# Patient Record
Sex: Female | Born: 1985 | Race: White | Hispanic: No | Marital: Single | Smoking: Current every day smoker
Health system: Southern US, Community
[De-identification: ages and names within clinical notes are randomized; demographics above are authoritative.]

## PROBLEM LIST (undated history)

## (undated) DIAGNOSIS — K759 Inflammatory liver disease, unspecified: Secondary | ICD-10-CM

## (undated) HISTORY — PX: ECTOPIC PREGNANCY SURGERY: SHX613

---

## 2010-02-18 ENCOUNTER — Emergency Department (HOSPITAL_BASED_OUTPATIENT_CLINIC_OR_DEPARTMENT_OTHER)
Admission: EM | Admit: 2010-02-18 | Discharge: 2010-02-18 | Payer: Self-pay | Source: Home / Self Care | Admitting: Emergency Medicine

## 2018-12-23 ENCOUNTER — Emergency Department (HOSPITAL_BASED_OUTPATIENT_CLINIC_OR_DEPARTMENT_OTHER)
Admission: EM | Admit: 2018-12-23 | Discharge: 2018-12-23 | Disposition: A | Discharge status: Home / Self Care | Payer: Self-pay | Attending: Emergency Medicine | Admitting: Emergency Medicine

## 2018-12-23 ENCOUNTER — Other Ambulatory Visit: Payer: Self-pay

## 2018-12-23 ENCOUNTER — Encounter (HOSPITAL_BASED_OUTPATIENT_CLINIC_OR_DEPARTMENT_OTHER): Payer: Self-pay | Admitting: Emergency Medicine

## 2018-12-23 DIAGNOSIS — J020 Streptococcal pharyngitis: Secondary | ICD-10-CM | POA: Insufficient documentation

## 2018-12-23 MED ORDER — IBUPROFEN 400 MG PO TABS
ORAL_TABLET | ORAL | Status: AC
  Filled 2018-12-23: qty 2

## 2018-12-23 MED ORDER — IBUPROFEN 600 MG PO TABS: 600.0000 mg | ORAL_TABLET | Freq: Four times a day (QID) | ORAL | 0 refills | Status: DC | PRN

## 2018-12-23 MED ORDER — IBUPROFEN 800 MG PO TABS
800.0000 mg | ORAL_TABLET | Freq: Once | ORAL | Status: AC
  Administered 2018-12-23: 800 mg via ORAL

## 2018-12-23 MED ORDER — LIDOCAINE VISCOUS HCL 2 % MT SOLN
15.0000 mL | Freq: Once | OROMUCOSAL | Status: AC
  Administered 2018-12-23: 15 mL via OROMUCOSAL
  Filled 2018-12-23: qty 15

## 2018-12-23 MED ORDER — PENICILLIN G BENZATHINE 1200000 UNIT/2ML IM SUSP
1.2000 10*6.[IU] | Freq: Once | INTRAMUSCULAR | Status: AC
  Administered 2018-12-23: 1.2 10*6.[IU] via INTRAMUSCULAR
  Filled 2018-12-23: qty 2

## 2018-12-23 NOTE — ED Triage Notes (Signed)
Sore throat with fever x 3 days.  Not improving.  Pt also fell last night and hit her mouth.  Throat is red and noted white splotches.

## 2018-12-23 NOTE — Discharge Instructions (Signed)
You should start feel better over the next 1 to 2 days.  You may take Tylenol and ibuprofen as needed for body aches, sore throat and fever.

## 2018-12-23 NOTE — ED Provider Notes (Signed)
[facility] EMERGENCY DEPARTMENT Provider Note   CSN: [id number] Arrival date & time: 12/23/18  1406    History   Chief Complaint Chief Complaint  Patient presents with  . Sore Throat    HPI Victoria Valenzuela is a 33 y.o. female.     HPI Patient states she has had 4 to 5 days of sore throat, fever, chills, diffuse myalgias.  She has taken no Tylenol or ibuprofen prior to coming to the emergency department.  Tolerating secretions.  No voice changes.  No known sick contacts.  No shortness of breath or coughing. No past medical history on file.  There are no active problems to display for this patient.    OB History   No obstetric history on file.      Home Medications    Prior to Admission medications   Medication Sig Start Date End Date Taking? Authorizing Provider  ibuprofen (ADVIL) 600 MG tablet Take 1 tablet (600 mg total) by mouth every 6 (six) hours as needed. 12/23/18   Loren Racer, MD    Family History No family history on file.  Social History Social History   Tobacco Use  . Smoking status: Not on file  Substance Use Topics  . Alcohol use: Not on file  . Drug use: Not on file     Allergies   Patient has no known allergies.   Review of Systems Review of Systems  Constitutional: Positive for chills and fever.  HENT: Positive for sore throat. Negative for congestion and voice change.   Respiratory: Negative for cough and shortness of breath.   Skin: Negative for rash.  All other systems reviewed and are negative.    Physical Exam Updated Vital Signs BP 126/79   Pulse (!) 107   Temp 99.8 F (37.7 C)   Ht 5\' 8"  (1.727 m)   Wt 81.6 kg   SpO2 97%   BMI 27.37 kg/m   Physical Exam Vitals signs and nursing note reviewed.  Constitutional:      Appearance: Normal appearance. She is well-developed.     Comments: Tearful  HENT:     Head: Normocephalic and atraumatic.     Mouth/Throat:     Mouth: Mucous membranes are moist.   Pharynx: Oropharyngeal exudate and posterior oropharyngeal erythema present.     Comments: Bilateral tonsillar hypertrophy with exudates in the left tonsil.  Uvula is midline.  No evidence of peritonsillar abscess.  No trismus. Tolerating secretions well. Eyes:     Extraocular Movements: Extraocular movements intact.     Pupils: Pupils are equal, round, and reactive to light.  Neck:     Musculoskeletal: Normal range of motion and neck supple. No neck rigidity or muscular tenderness.     Comments: No meningismus Cardiovascular:     Rate and Rhythm: Normal rate.  Pulmonary:     Effort: Pulmonary effort is normal.  Musculoskeletal: Normal range of motion.        General: No tenderness.  Lymphadenopathy:     Cervical: Cervical adenopathy present.  Skin:    General: Skin is warm and dry.     Findings: No erythema or rash.  Neurological:     General: No focal deficit present.     Mental Status: She is alert and oriented to person, place, and time.  Psychiatric:        Behavior: Behavior normal.      ED Treatments / Results  Labs (all labs ordered are listed, but only abnormal  results are displayed) Labs Reviewed - No data to display  EKG None  Radiology No results found.  Procedures Procedures (including critical care time)  Medications Ordered in ED Medications  penicillin g benzathine (BICILLIN LA) 1200000 UNIT/2ML injection 1.2 Million Units (has no administration in time range)  ibuprofen (ADVIL) tablet 800 mg (800 mg Oral Given 12/23/18 1419)     Initial Impression / Assessment and Plan / ED Course  I have reviewed the triage vital signs and the nursing notes.  Pertinent labs & imaging results that were available during my care of the patient were reviewed by me and considered in my medical decision making (see chart for details).        Given IM penicillin and will treat with ibuprofen.  No evidence of complications due to strep throat.  Encouraged plenty of  fluid intake.  Return precautions given.  Final Clinical Impressions(s) / ED Diagnoses   Final diagnoses:  Strep throat    ED Discharge Orders         Ordered    ibuprofen (ADVIL) 600 MG tablet  Every 6 hours PRN     12/23/18 1423           Loren RacerYelverton, Norlene Lanes, MD 12/23/18 1429

## 2019-02-22 ENCOUNTER — Encounter (HOSPITAL_COMMUNITY): Payer: Self-pay | Admitting: Emergency Medicine

## 2019-02-22 ENCOUNTER — Inpatient Hospital Stay (HOSPITAL_COMMUNITY)
Admission: EM | Admit: 2019-02-22 | Discharge: 2019-02-25 | DRG: 442 | Disposition: A | Discharge status: Home / Self Care | Payer: Self-pay | Attending: Internal Medicine | Admitting: Internal Medicine

## 2019-02-22 ENCOUNTER — Emergency Department (HOSPITAL_BASED_OUTPATIENT_CLINIC_OR_DEPARTMENT_OTHER)
Admission: EM | Admit: 2019-02-22 | Discharge: 2019-02-22 | Discharge status: Against Medical Advice | Payer: Self-pay | Attending: Emergency Medicine | Admitting: Emergency Medicine

## 2019-02-22 ENCOUNTER — Other Ambulatory Visit: Payer: Self-pay

## 2019-02-22 ENCOUNTER — Encounter (HOSPITAL_BASED_OUTPATIENT_CLINIC_OR_DEPARTMENT_OTHER): Payer: Self-pay | Admitting: Emergency Medicine

## 2019-02-22 ENCOUNTER — Emergency Department (HOSPITAL_BASED_OUTPATIENT_CLINIC_OR_DEPARTMENT_OTHER): Payer: Self-pay

## 2019-02-22 DIAGNOSIS — N760 Acute vaginitis: Secondary | ICD-10-CM | POA: Diagnosis present

## 2019-02-22 DIAGNOSIS — F1110 Opioid abuse, uncomplicated: Secondary | ICD-10-CM | POA: Diagnosis present

## 2019-02-22 DIAGNOSIS — F1910 Other psychoactive substance abuse, uncomplicated: Secondary | ICD-10-CM | POA: Insufficient documentation

## 2019-02-22 DIAGNOSIS — A599 Trichomoniasis, unspecified: Secondary | ICD-10-CM | POA: Diagnosis present

## 2019-02-22 DIAGNOSIS — K759 Inflammatory liver disease, unspecified: Secondary | ICD-10-CM | POA: Insufficient documentation

## 2019-02-22 DIAGNOSIS — B159 Hepatitis A without hepatic coma: Principal | ICD-10-CM

## 2019-02-22 DIAGNOSIS — B179 Acute viral hepatitis, unspecified: Secondary | ICD-10-CM | POA: Diagnosis present

## 2019-02-22 DIAGNOSIS — N730 Acute parametritis and pelvic cellulitis: Secondary | ICD-10-CM | POA: Diagnosis present

## 2019-02-22 DIAGNOSIS — F17200 Nicotine dependence, unspecified, uncomplicated: Secondary | ICD-10-CM | POA: Diagnosis present

## 2019-02-22 DIAGNOSIS — E876 Hypokalemia: Secondary | ICD-10-CM | POA: Diagnosis present

## 2019-02-22 DIAGNOSIS — Z20828 Contact with and (suspected) exposure to other viral communicable diseases: Secondary | ICD-10-CM | POA: Diagnosis present

## 2019-02-22 DIAGNOSIS — F17210 Nicotine dependence, cigarettes, uncomplicated: Secondary | ICD-10-CM | POA: Insufficient documentation

## 2019-02-22 DIAGNOSIS — K7290 Hepatic failure, unspecified without coma: Secondary | ICD-10-CM | POA: Diagnosis present

## 2019-02-22 DIAGNOSIS — R17 Unspecified jaundice: Secondary | ICD-10-CM

## 2019-02-22 DIAGNOSIS — B1920 Unspecified viral hepatitis C without hepatic coma: Secondary | ICD-10-CM

## 2019-02-22 DIAGNOSIS — R1011 Right upper quadrant pain: Secondary | ICD-10-CM

## 2019-02-22 DIAGNOSIS — N739 Female pelvic inflammatory disease, unspecified: Secondary | ICD-10-CM

## 2019-02-22 HISTORY — DX: Inflammatory liver disease, unspecified: K75.9

## 2019-02-22 LAB — URINALYSIS, ROUTINE W REFLEX MICROSCOPIC
Glucose, UA: 100 mg/dL — AB
Ketones, ur: 15 mg/dL — AB
Nitrite: POSITIVE — AB
Protein, ur: 30 mg/dL — AB
Specific Gravity, Urine: >1.03 — ABNORMAL HIGH (ref 1.005–1.030)
pH: 6.5 (ref 5.0–8.0)

## 2019-02-22 LAB — CBC
HCT: 39.5 % (ref 36.0–46.0)
Hemoglobin: 12.7 g/dL (ref 12.0–15.0)
MCH: 28 pg (ref 26.0–34.0)
MCHC: 32.2 g/dL (ref 30.0–36.0)
MCV: 87.2 fL (ref 80.0–100.0)
Platelets: 313 10*3/uL (ref 150–400)
RBC: 4.53 MIL/uL (ref 3.87–5.11)
RDW: 15.6 % — ABNORMAL HIGH (ref 11.5–15.5)
WBC: 4.9 10*3/uL (ref 4.0–10.5)
nRBC: 0 % (ref 0.0–0.2)

## 2019-02-22 LAB — COMPREHENSIVE METABOLIC PANEL
ALT: 1120 U/L — ABNORMAL HIGH (ref 0–44)
AST: 730 U/L — ABNORMAL HIGH (ref 15–41)
Albumin: 3.2 g/dL — ABNORMAL LOW (ref 3.5–5.0)
Alkaline Phosphatase: 156 U/L — ABNORMAL HIGH (ref 38–126)
Anion gap: 9 (ref 5–15)
BUN: 5 mg/dL — ABNORMAL LOW (ref 6–20)
CO2: 27 mmol/L (ref 22–32)
Calcium: 8.7 mg/dL — ABNORMAL LOW (ref 8.9–10.3)
Chloride: 99 mmol/L (ref 98–111)
Creatinine, Ser: 0.67 mg/dL (ref 0.44–1.00)
GFR calc Af Amer: >60 mL/min (ref >60)
GFR calc non Af Amer: >60 mL/min (ref >60)
Glucose, Bld: 96 mg/dL (ref 70–99)
Potassium: 3 mmol/L — ABNORMAL LOW (ref 3.5–5.1)
Sodium: 135 mmol/L (ref 135–145)
Total Bilirubin: 7.2 mg/dL — ABNORMAL HIGH (ref 0.3–1.2)
Total Protein: 6.9 g/dL (ref 6.5–8.1)

## 2019-02-22 LAB — URINALYSIS, MICROSCOPIC (REFLEX)

## 2019-02-22 LAB — PROTIME-INR
INR: 1.3 — ABNORMAL HIGH (ref 0.8–1.2)
Prothrombin Time: 15.6 seconds — ABNORMAL HIGH (ref 11.4–15.2)

## 2019-02-22 LAB — ETHANOL: Alcohol, Ethyl (B): <10 mg/dL (ref <10)

## 2019-02-22 LAB — ACETAMINOPHEN LEVEL: Acetaminophen (Tylenol), Serum: <10 ug/mL — ABNORMAL LOW (ref 10–30)

## 2019-02-22 LAB — PREGNANCY, URINE: Preg Test, Ur: NEGATIVE

## 2019-02-22 LAB — LIPASE, BLOOD: Lipase: 29 U/L (ref 11–51)

## 2019-02-22 MED ORDER — SODIUM CHLORIDE 0.9 % IV BOLUS
1000.0000 mL | Freq: Once | INTRAVENOUS | Status: AC
  Administered 2019-02-22: 1000 mL via INTRAVENOUS

## 2019-02-22 MED ORDER — ONDANSETRON HCL 4 MG/2ML IJ SOLN
4.0000 mg | Freq: Once | INTRAMUSCULAR | Status: AC
  Administered 2019-02-22: 18:00:00 4 mg via INTRAVENOUS
  Filled 2019-02-22: qty 2

## 2019-02-22 MED ORDER — POTASSIUM CHLORIDE 10 MEQ/100ML IV SOLN
10.0000 meq | Freq: Once | INTRAVENOUS | Status: AC
  Administered 2019-02-22: 10 meq via INTRAVENOUS
  Filled 2019-02-22: qty 100

## 2019-02-22 NOTE — ED Provider Notes (Signed)
[facility] EMERGENCY DEPARTMENT Provider Note   CSN: [id number] Arrival date & time: 02/22/19  1626   History   Chief Complaint Chief Complaint  Patient presents with  . Abdominal Pain    HPI Victoria Valenzuela is a 33 y.o. female who presents with weakness.  Past medical history significant for IV drug use, polysubstance abuse.  The patient states that for the past 4 days she has had intermittent generalized abdominal pain, nausea, vomiting, diarrhea.  She states that she has been progressively weaker which is the reason why she came to the ED today.  She also has noticed that her eyes have become yellow in her urine is brown or orange and she urinates frequently.  She reports an associated fever couple of days ago.  The diarrhea is light in color. Last time she used heroin was 2 days ago. She states she OD'd on heroin last week and EMS was called out but she didn't want to go to the hospital at that time. She denies significant alcohol use. She smokes marijuana but denies other drug use.   HPI  History reviewed. No pertinent past medical history.  There are no active problems to display for this patient.    The histories are not reviewed yet. Please review them in the "History" navigator section and refresh this [place].   OB History   No obstetric history on file.      Home Medications    Prior to Admission medications   Medication Sig Start Date End Date Taking? Authorizing Provider  ibuprofen (ADVIL) 600 MG tablet Take 1 tablet (600 mg total) by mouth every 6 (six) hours as needed. 12/23/18   Julianne Rice, MD    Family History No family history on file.  Social History Social History   Tobacco Use  . Smoking status: Current Every Day Smoker  . Smokeless tobacco: Never Used  Substance Use Topics  . Alcohol use: Yes    Comment: daily  . Drug use: Yes    Comment: heroin     Allergies   Patient has no known allergies.   Review of Systems Review  of Systems  Constitutional: Positive for fatigue and fever. Negative for chills.  Respiratory: Negative for shortness of breath.   Cardiovascular: Negative for chest pain.  Gastrointestinal: Positive for abdominal pain, diarrhea, nausea and vomiting.  Genitourinary: Positive for frequency. Negative for dysuria.       +dark urine  Neurological: Positive for weakness.  All other systems reviewed and are negative.    Physical Exam Updated Vital Signs BP 114/71 (BP Location: Right Arm)   Pulse 95   Temp 99 F (37.2 C) (Oral)   Resp 20   Ht 5\' 9"  (1.753 m)   Wt 81.6 kg   LMP 01/22/2019   SpO2 99%   BMI 26.58 kg/m   Physical Exam Vitals signs and nursing note reviewed.  Constitutional:      General: She is not in acute distress.    Appearance: She is well-developed. She is ill-appearing.     Comments: Calm and cooperative  HENT:     Head: Normocephalic and atraumatic.  Eyes:     General: Scleral icterus present.        Right eye: No discharge.        Left eye: No discharge.     Conjunctiva/sclera: Conjunctivae normal.     Pupils: Pupils are equal, round, and reactive to light.  Neck:     Musculoskeletal: Normal  range of motion.  Cardiovascular:     Rate and Rhythm: Normal rate and regular rhythm.  Pulmonary:     Effort: Pulmonary effort is normal. No respiratory distress.     Breath sounds: Normal breath sounds.  Abdominal:     General: There is no distension.     Palpations: Abdomen is soft.     Tenderness: There is no abdominal tenderness.  Skin:    General: Skin is warm and dry.  Neurological:     Mental Status: She is alert and oriented to person, place, and time.  Psychiatric:        Behavior: Behavior normal.      ED Treatments / Results  Labs (all labs ordered are listed, but only abnormal results are displayed) Labs Reviewed  COMPREHENSIVE METABOLIC PANEL - Abnormal; Notable for the following components:      Result Value   Potassium 3.0 (*)     BUN 5 (*)    Calcium 8.7 (*)    Albumin 3.2 (*)    AST 730 (*)    ALT 1,120 (*)    Alkaline Phosphatase 156 (*)    Total Bilirubin 7.2 (*)    All other components within normal limits  CBC - Abnormal; Notable for the following components:   RDW 15.6 (*)    All other components within normal limits  URINALYSIS, ROUTINE W REFLEX MICROSCOPIC - Abnormal; Notable for the following components:   Color, Urine BROWN (*)    APPearance CLOUDY (*)    Specific Gravity, Urine >1.030 (*)    Glucose, UA 100 (*)    Hgb urine dipstick TRACE (*)    Bilirubin Urine LARGE (*)    Ketones, ur 15 (*)    Protein, ur 30 (*)    Nitrite POSITIVE (*)    Leukocytes,Ua TRACE (*)    All other components within normal limits  URINALYSIS, MICROSCOPIC (REFLEX) - Abnormal; Notable for the following components:   Bacteria, UA MANY (*)    All other components within normal limits  PROTIME-INR - Abnormal; Notable for the following components:   Prothrombin Time 15.6 (*)    INR 1.3 (*)    All other components within normal limits  ACETAMINOPHEN LEVEL - Abnormal; Notable for the following components:   Acetaminophen (Tylenol), Serum <10 (*)    All other components within normal limits  URINE CULTURE  LIPASE, BLOOD  PREGNANCY, URINE  ETHANOL  HEPATITIS PANEL, ACUTE    EKG None  Radiology [place] Abdomen Limited Ruq  Result Date: 02/22/2019 CLINICAL DATA:  Epigastric pain EXAM: ULTRASOUND ABDOMEN LIMITED RIGHT UPPER QUADRANT COMPARISON:  None. FINDINGS: Gallbladder: thickened gallbladder wall to 7 mm. The gallbladder is nondistended. No gallstones are present. No pericholecystic fluid. Negative sonographic Murphy's sign. Common bile duct: Diameter: Normal at 4 mm Liver: Homogeneous liver echotexture. No duct dilatation. Portal vein is patent on color Doppler imaging with normal direction of blood flow towards the liver. Other: None. IMPRESSION: 1. Thickened gallbladder wall without evidence of cholecystitis. No  gallstones present. No gallbladder distension. 2. Normal common bile duct and liver. Electronically Signed   By: Genevive BiStewart  Edmunds M.D.   On: 02/22/2019 18:38    Procedures Procedures (including critical care time)  Medications Ordered in ED Medications  sodium chloride 0.9 % bolus 1,000 mL ( Intravenous Stopped 02/22/19 1913)  ondansetron (ZOFRAN) injection 4 mg (4 mg Intravenous Given 02/22/19 1750)  potassium chloride 10 mEq in 100 mL IVPB ( Intravenous Stopped 02/22/19 1858)  Initial Impression / Assessment and Plan / ED Course  I have reviewed the triage vital signs and the nursing notes.  Pertinent labs & imaging results that were available during my care of the patient were reviewed by me and considered in my medical decision making (see chart for details).  33 year old female presents with subjective fever, abdominal pain, nausea, vomiting, diarrhea and jaundice.  Her vital signs are normal here.  On exam she is mildly jaundiced appearing.  Her heart is regular rate and rhythm.  Lungs are clear to auscultation.  Abdomen is soft and nontender.  She appears generally weak and fatigued.  Will order labs and give fluids and Zofran.  CBC is normal.  CMP is remarkable for hypokalemia (3), and significantly elevated transaminases and bilirubin.  AST is 730, ALT is 1120, bilirubin is 7.2, alkaline phosphatase is 156.  INR is 1.3.  Right upper quadrant ultrasound was obtained which shows a thickened gallbladder wall without evidence of cholecystitis.  Hepatitis panel was added.    Discussed with Dr. Levora AngelBrahmbhatt of GI who recommends admission to the hospital for supportive care.  I discussed this with the patient and she is refusing stating that she needs to go take care of her kids.  She has a brother but otherwise has no other family to help her.  At this time she has capacity to make her own decisions.  She understands the risks of leaving.  She states that she just needs to get her kids situated  and will come back tonight.  [facility] was [facility]. Pt left AMA.  Final Clinical Impressions(s) / ED Diagnoses   Final diagnoses:  RUQ abdominal pain  Hepatitis    ED Discharge Orders    None       Bethel BornGekas, Kinneth Fujiwara Marie, PA-C 02/22/19 2146    Pricilla LovelessGoldston, Scott, MD 02/23/19 2248

## 2019-02-22 NOTE — ED Triage Notes (Addendum)
Generalized abd pain, vomiting, diarrhea, yellowing of the eyes x 1 week. She recently had a heroin OD. She looks unwell.

## 2019-02-22 NOTE — Discharge Instructions (Addendum)
Do not take any Tylenol or drink any alcohol

## 2019-02-22 NOTE — ED Triage Notes (Signed)
Patient reports upper abdominal pain for 4 days with emesis and diarrhea , denies fever or chills , seen at [facility] ER this afternoon - blood tests , urine test and ultrasound done at [place].

## 2019-02-23 ENCOUNTER — Emergency Department (HOSPITAL_COMMUNITY): Payer: Self-pay

## 2019-02-23 ENCOUNTER — Inpatient Hospital Stay (HOSPITAL_COMMUNITY): Payer: Self-pay

## 2019-02-23 DIAGNOSIS — R17 Unspecified jaundice: Secondary | ICD-10-CM

## 2019-02-23 DIAGNOSIS — F1110 Opioid abuse, uncomplicated: Secondary | ICD-10-CM | POA: Diagnosis present

## 2019-02-23 DIAGNOSIS — N730 Acute parametritis and pelvic cellulitis: Secondary | ICD-10-CM | POA: Diagnosis present

## 2019-02-23 DIAGNOSIS — B179 Acute viral hepatitis, unspecified: Secondary | ICD-10-CM | POA: Diagnosis present

## 2019-02-23 DIAGNOSIS — E876 Hypokalemia: Secondary | ICD-10-CM | POA: Diagnosis present

## 2019-02-23 LAB — C-REACTIVE PROTEIN: CRP: 3.6 mg/dL — ABNORMAL HIGH (ref <1.0)

## 2019-02-23 LAB — COMPREHENSIVE METABOLIC PANEL
ALT: 730 U/L — ABNORMAL HIGH (ref 0–44)
AST: 318 U/L — ABNORMAL HIGH (ref 15–41)
Albumin: 2.5 g/dL — ABNORMAL LOW (ref 3.5–5.0)
Alkaline Phosphatase: 170 U/L — ABNORMAL HIGH (ref 38–126)
Anion gap: 8 (ref 5–15)
BUN: <5 mg/dL — ABNORMAL LOW (ref 6–20)
CO2: 23 mmol/L (ref 22–32)
Calcium: 8.2 mg/dL — ABNORMAL LOW (ref 8.9–10.3)
Chloride: 106 mmol/L (ref 98–111)
Creatinine, Ser: 0.65 mg/dL (ref 0.44–1.00)
GFR calc Af Amer: >60 mL/min (ref >60)
GFR calc non Af Amer: >60 mL/min (ref >60)
Glucose, Bld: 107 mg/dL — ABNORMAL HIGH (ref 70–99)
Potassium: 3.4 mmol/L — ABNORMAL LOW (ref 3.5–5.1)
Sodium: 137 mmol/L (ref 135–145)
Total Bilirubin: 5.4 mg/dL — ABNORMAL HIGH (ref 0.3–1.2)
Total Protein: 5.8 g/dL — ABNORMAL LOW (ref 6.5–8.1)

## 2019-02-23 LAB — RAPID URINE DRUG SCREEN, HOSP PERFORMED
Amphetamines: POSITIVE — AB
Barbiturates: NOT DETECTED
Benzodiazepines: NOT DETECTED
Cocaine: POSITIVE — AB
Opiates: NOT DETECTED
Tetrahydrocannabinol: POSITIVE — AB

## 2019-02-23 LAB — SARS CORONAVIRUS 2 BY RT PCR (HOSPITAL ORDER, PERFORMED IN ~~LOC~~ HOSPITAL LAB): SARS Coronavirus 2: NEGATIVE

## 2019-02-23 LAB — WET PREP, GENITAL
Sperm: NONE SEEN
Yeast Wet Prep HPF POC: NONE SEEN

## 2019-02-23 LAB — RPR: RPR Ser Ql: NONREACTIVE

## 2019-02-23 LAB — CBC
HCT: 33.3 % — ABNORMAL LOW (ref 36.0–46.0)
Hemoglobin: 11.3 g/dL — ABNORMAL LOW (ref 12.0–15.0)
MCH: 28.9 pg (ref 26.0–34.0)
MCHC: 33.9 g/dL (ref 30.0–36.0)
MCV: 85.2 fL (ref 80.0–100.0)
Platelets: 251 10*3/uL (ref 150–400)
RBC: 3.91 MIL/uL (ref 3.87–5.11)
RDW: 16 % — ABNORMAL HIGH (ref 11.5–15.5)
WBC: 4.7 10*3/uL (ref 4.0–10.5)
nRBC: 0 % (ref 0.0–0.2)

## 2019-02-23 LAB — HIV ANTIBODY (ROUTINE TESTING W REFLEX): HIV Screen 4th Generation wRfx: NONREACTIVE

## 2019-02-23 LAB — AMMONIA: Ammonia: 71 umol/L — ABNORMAL HIGH (ref 9–35)

## 2019-02-23 LAB — PROTIME-INR
INR: 1.3 — ABNORMAL HIGH (ref 0.8–1.2)
Prothrombin Time: 15.9 seconds — ABNORMAL HIGH (ref 11.4–15.2)

## 2019-02-23 MED ORDER — DOXYCYCLINE HYCLATE 100 MG PO TABS
100.0000 mg | ORAL_TABLET | Freq: Two times a day (BID) | ORAL | Status: DC
  Administered 2019-02-23 – 2019-02-25 (×5): 100 mg via ORAL
  Filled 2019-02-23 (×6): qty 1

## 2019-02-23 MED ORDER — KETOROLAC TROMETHAMINE 30 MG/ML IJ SOLN
30.0000 mg | Freq: Four times a day (QID) | INTRAMUSCULAR | Status: DC | PRN
  Administered 2019-02-23 – 2019-02-25 (×7): 30 mg via INTRAVENOUS
  Filled 2019-02-23 (×7): qty 1

## 2019-02-23 MED ORDER — METRONIDAZOLE 500 MG PO TABS
500.0000 mg | ORAL_TABLET | Freq: Two times a day (BID) | ORAL | Status: DC
  Administered 2019-02-23 – 2019-02-25 (×5): 500 mg via ORAL
  Filled 2019-02-23 (×5): qty 1

## 2019-02-23 MED ORDER — ONDANSETRON HCL 4 MG/2ML IJ SOLN
4.0000 mg | Freq: Once | INTRAMUSCULAR | Status: AC
  Administered 2019-02-23: 4 mg via INTRAVENOUS
  Filled 2019-02-23: qty 2

## 2019-02-23 MED ORDER — POTASSIUM CHLORIDE 10 MEQ/100ML IV SOLN
10.0000 meq | Freq: Once | INTRAVENOUS | Status: AC
  Administered 2019-02-23: 05:00:00 10 meq via INTRAVENOUS
  Filled 2019-02-23: qty 100

## 2019-02-23 MED ORDER — TRAZODONE HCL 50 MG PO TABS
50.0000 mg | ORAL_TABLET | Freq: Once | ORAL | Status: AC
  Administered 2019-02-23: 23:00:00 50 mg via ORAL
  Filled 2019-02-23: qty 1

## 2019-02-23 MED ORDER — DEXTROSE-NACL 5-0.9 % IV SOLN
INTRAVENOUS | Status: DC
  Administered 2019-02-23: 07:00:00 via INTRAVENOUS

## 2019-02-23 MED ORDER — ONDANSETRON HCL 4 MG PO TABS: 4.0000 mg | ORAL_TABLET | Freq: Four times a day (QID) | ORAL | Status: DC | PRN

## 2019-02-23 MED ORDER — LACTATED RINGERS IV SOLN: INTRAVENOUS | Status: DC

## 2019-02-23 MED ORDER — ONDANSETRON HCL 4 MG/2ML IJ SOLN
4.0000 mg | Freq: Four times a day (QID) | INTRAMUSCULAR | Status: DC | PRN
  Administered 2019-02-23: 16:00:00 4 mg via INTRAVENOUS
  Filled 2019-02-23: qty 2

## 2019-02-23 MED ORDER — POTASSIUM CHLORIDE CRYS ER 20 MEQ PO TBCR
40.0000 meq | EXTENDED_RELEASE_TABLET | Freq: Once | ORAL | Status: AC
  Administered 2019-02-23: 04:00:00 40 meq via ORAL
  Filled 2019-02-23 (×2): qty 2

## 2019-02-23 MED ORDER — SODIUM CHLORIDE 0.9 % IV SOLN
2.0000 g | Freq: Once | INTRAVENOUS | Status: AC
  Administered 2019-02-23: 07:00:00 2 g via INTRAVENOUS
  Filled 2019-02-23 (×2): qty 2

## 2019-02-23 MED ORDER — DEXTROSE 5 % IV SOLN
15.0000 mg/kg/h | INTRAVENOUS | Status: DC
  Administered 2019-02-23 – 2019-02-24 (×2): 15 mg/kg/h via INTRAVENOUS
  Filled 2019-02-23 (×2): qty 120

## 2019-02-23 MED ORDER — ACETYLCYSTEINE LOAD VIA INFUSION
150.0000 mg/kg | Freq: Once | INTRAVENOUS | Status: AC
  Administered 2019-02-23: 14:00:00 12180 mg via INTRAVENOUS
  Filled 2019-02-23: qty 305

## 2019-02-23 MED ORDER — SODIUM CHLORIDE 0.9 % IV BOLUS
1000.0000 mL | Freq: Once | INTRAVENOUS | Status: AC
  Administered 2019-02-23: 05:00:00 1000 mL via INTRAVENOUS

## 2019-02-23 MED ORDER — DICYCLOMINE HCL 10 MG/ML IM SOLN
20.0000 mg | Freq: Once | INTRAMUSCULAR | Status: AC
  Administered 2019-02-23: 04:00:00 20 mg via INTRAMUSCULAR
  Filled 2019-02-23: qty 2

## 2019-02-23 MED ORDER — PHYTONADIONE 5 MG PO TABS
10.0000 mg | ORAL_TABLET | Freq: Every day | ORAL | Status: AC
  Administered 2019-02-24 – 2019-02-25 (×2): 10 mg via ORAL
  Filled 2019-02-23 (×3): qty 2

## 2019-02-23 MED ORDER — VITAMIN K1 10 MG/ML IJ SOLN
10.0000 mg | Freq: Once | INTRAVENOUS | Status: AC
  Administered 2019-02-23: 10 mg via INTRAVENOUS
  Filled 2019-02-23: qty 1

## 2019-02-23 NOTE — H&P (Signed)
History and Physical   Victoria Valenzuela UEA:[id number] DOB: May 24, 1986 DOA: 02/22/2019  Referring MD/NP/PA: Dr. Preston FleetingGlick  PCP: Patient, No Pcp Per   Outpatient Specialists: None  Patient coming from: Home  Chief Complaint: Abdominal pain and weakness  HPI: Victoria Valenzuela is a 33 y.o. female with medical history significant of IV drug abuse mainly heroin, cannabis use, high risk sexual encounters who presented [facility] with complaint of upper abdominal pain nausea vomiting and diarrhea for about a week.  Patient noted gradual yellow discoloration of her eyes.  Also malaise.  She also noted increased urinary frequency and vaginal discharge which was mild address.  She had dyspareunia.  In the last 1 week she has had fever on and off with some chills.  She has been sexually active with at least 2 partners.  She has also used heroin for a number of years.  Her last use was about a week ago.  She is now using intranasal instead of injected.  Patient is social alcohol drinker but takes Tylenol PM every night.  No exposure to anybody was COVID-19.  [facility] and found to have markedly elevated liver transaminases.  Work-up with ultrasound of the liver showed no evidence of obstructive jaundice with gallbladder thickening but no evidence of cholecystitis.  Acute hepatitis secondary to liver injury or viral hepatitis was suspected.  GI was consulted but patient left AGAINST MEDICAL ADVICE.  She is came to [facility] now with the same complaints.  Her acute otitis panel still pending.  Patient had pelvic exam done showing significant adnexal tenderness with malodorous discharge.  She is positive for bacterial vaginosis and trichomoniasis.  Patient has been initiated on IV Cefotan and doxycycline.  Also Flagyl in the ER and she has been admitted for further work-up..  ED Course: Temperature is 99 blood pressure 103/70 pulse 95 respiratory rate of 22  oxygen sat 99% on room air.  White count is 4.9 globin 12.7 and platelets 313.  Potassium 3.0 BUN 5 creatinine 0.67 calcium 8.7 INR 1.3.  Lipase is 29 albumin 3.2.  AST 730 ALT 07/24/2018 and total bilirubin of 7.2.  Alcohol level is less than 10.  Wet prep is positive for trichomoniasis as well as clue cells.  Abdominal ultrasound showed thickened gallbladder wall without evidence of cholecystitis no gallstones.  Pelvic ultrasound currently pending.  Review of Systems: As per HPI otherwise 10 point review of systems negative.    Past Medical History:  Diagnosis Date  . Hepatitis     Past Surgical History:  Procedure Laterality Date  . ECTOPIC PREGNANCY SURGERY       reports that she has been smoking. She has never used smokeless tobacco. She reports current alcohol use. She reports current drug use.  No Known Allergies  No family history on file.   Prior to Admission medications   Medication Sig Start Date End Date Taking? Authorizing Provider  ibuprofen (ADVIL) 600 MG tablet Take 1 tablet (600 mg total) by mouth every 6 (six) hours as needed. 12/23/18   Loren RacerYelverton, David, MD    Physical Exam: Vitals:   02/22/19 2351 02/22/19 2353  BP: 115/82   Pulse: 95   Resp:  (!) 22  Temp: 98.4 F (36.9 C)   TempSrc: Oral   SpO2: 96%       Constitutional: NAD, anxious Vitals:   02/22/19 2351 02/22/19 2353  BP: 115/82   Pulse: 95  Resp:  (!) 22  Temp: 98.4 F (36.9 C)   TempSrc: Oral   SpO2: 96%    Eyes: PERRL, lids and conjunctivae normal ENMT: Mucous membranes are dry. Posterior pharynx clear of any exudate or lesions.Normal dentition.  Neck: normal, supple, no masses, no thyromegaly Respiratory: clear to auscultation bilaterally, no wheezing, no crackles. Normal respiratory effort. No accessory muscle use.  Cardiovascular: Regular rate and rhythm, no murmurs / rubs / gallops. No extremity edema. 2+ pedal pulses. No carotid bruits.  Abdomen: Diffuse epigastric tenderness no  masses palpated. No hepatosplenomegaly. Bowel sounds positive.  GU exam by ER showed bilateral adnexal tenderness, foul-smelling discharge. Musculoskeletal: no clubbing / cyanosis. No joint deformity upper and lower extremities. Good ROM, no contractures. Normal muscle tone.  Skin: no rashes, lesions, ulcers. No induration Neurologic: CN 2-12 grossly intact. Sensation intact, DTR normal. Strength 5/5 in all 4.  Psychiatric: Normal judgment and insight. Alert and oriented x 3. Normal mood.     Labs on Admission: I have personally reviewed following labs and imaging studies  CBC: Recent Labs  Lab 02/22/19 1704  WBC 4.9  HGB 12.7  HCT 39.5  MCV 87.2  PLT 313   Basic Metabolic Panel: Recent Labs  Lab 02/22/19 1704  NA 135  K 3.0*  CL 99  CO2 27  GLUCOSE 96  BUN 5*  CREATININE 0.67  CALCIUM 8.7*   GFR: Estimated Creatinine Clearance: 117.5 mL/min (by C-G formula based on SCr of 0.67 mg/dL). Liver Function Tests: Recent Labs  Lab 02/22/19 1704  AST 730*  ALT 1,120*  ALKPHOS 156*  BILITOT 7.2*  PROT 6.9  ALBUMIN 3.2*   Recent Labs  Lab 02/22/19 1704  LIPASE 29   No results for input(s): AMMONIA in the last 168 hours. Coagulation Profile: Recent Labs  Lab 02/22/19 1939  INR 1.3*   Cardiac Enzymes: No results for input(s): CKTOTAL, CKMB, CKMBINDEX, TROPONINI in the last 168 hours. BNP (last 3 results) No results for input(s): PROBNP in the last 8760 hours. HbA1C: No results for input(s): HGBA1C in the last 72 hours. CBG: No results for input(s): GLUCAP in the last 168 hours. Lipid Profile: No results for input(s): CHOL, HDL, LDLCALC, TRIG, CHOLHDL, LDLDIRECT in the last 72 hours. Thyroid Function Tests: No results for input(s): TSH, T4TOTAL, FREET4, T3FREE, THYROIDAB in the last 72 hours. Anemia Panel: No results for input(s): VITAMINB12, FOLATE, FERRITIN, TIBC, IRON, RETICCTPCT in the last 72 hours. Urine analysis:    Component Value Date/Time    COLORURINE BROWN (A) 02/22/2019 1636   APPEARANCEUR CLOUDY (A) 02/22/2019 1636   LABSPEC >1.030 (H) 02/22/2019 1636   PHURINE 6.5 02/22/2019 1636   GLUCOSEU 100 (A) 02/22/2019 1636   HGBUR TRACE (A) 02/22/2019 1636   BILIRUBINUR LARGE (A) 02/22/2019 1636   KETONESUR 15 (A) 02/22/2019 1636   PROTEINUR 30 (A) 02/22/2019 1636   NITRITE POSITIVE (A) 02/22/2019 1636   LEUKOCYTESUR TRACE (A) 02/22/2019 1636   Sepsis Labs: @LABRCNTIP (procalcitonin:4,lacticidven:4) ) Recent Results (from the past 240 hour(s))  Wet prep, genital     Status: Abnormal   Collection Time: 02/23/19  3:40 AM   Specimen: Thin Prep Cervical/Endocervical  Result Value Ref Range Status   Yeast Wet Prep HPF POC NONE SEEN NONE SEEN Final   Trich, Wet Prep PRESENT (A) NONE SEEN Final   Clue Cells Wet Prep HPF POC PRESENT (A) NONE SEEN Final   WBC, Wet Prep HPF POC FEW (A) NONE SEEN Final   Sperm NONE  SEEN  Final    Comment: Performed at [facility], [place] [address], [place], [place] [address]     Radiological Exams on Admission: US Abdomen Limited Ruq  Result Date: 02/22/2019 CLINICAL DATA:  Epigastric pain EXAM: ULTRASOUND ABDOMEN LIMITED RIGHT UPPER QUADRANT COMPARISON:  None. FINDINGS: Gallbladder: thickened gallbladder wall to 7 mm. The gallbladder is nondistended. No gallstones are present. No pericholecystic fluid. Negative sonographic Murphy's sign. Common bile duct: Diameter: Normal at 4 mm Liver: Homogeneous liver echotexture. No duct dilatation. Portal vein is patent on color Doppler imaging with normal direction of blood flow towards the liver. Other: None. IMPRESSION: 1. Thickened gallbladder wall without evidence of cholecystitis. No gallstones present. No gallbladder distension. 2. Normal common bile duct and liver. Electronically Signed   By: Suzy Bouchard M.D.   On: 02/22/2019 18:38    Assessment/Plan Principal Problem:   Acute hepatitis Active Problems:   Hypokalemia   PID (acute pelvic  inflammatory disease)   Heroin abuse ([place])     #1 acute hepatitis: Differentials include acute hepatic injury from drugs, viral hepatitis especially in the setting of known IV drug abuse, alcoholic hepatitis but patient is not a known alcoholic.  GI has been consulted earlier.  Admit the patient for supportive care.  Follow LFTs closely.  Tylenol level is less than 10 despite Tylenol intake daily.  Follow recommendations by GI.  Awaiting acute hepatitis panel results.  #2 hypokalemia: Replete potassium.  #3 PID: Pelvic ultrasound pending.  We will continue with empiric treatment with antibiotics.  Also treatment for trichomoniasis.  #4 heroin abuse: Counseling provided.  Patient says she quit heroin for now.   DVT prophylaxis: SCD Code Status: Full code Family Communication: No family at bedside Disposition Plan: Home Consults called: Dr. Alessandra Bevels, gastroenterology.  Please notify him in the morning Admission status: Inpatient  Severity of Illness: The appropriate patient status for this patient is INPATIENT. Inpatient status is judged to be reasonable and necessary in order to provide the required intensity of service to ensure the patient's safety. The patient's presenting symptoms, physical exam findings, and initial radiographic and laboratory data in the context of their chronic comorbidities is felt to place them at high risk for further clinical deterioration. Furthermore, it is not anticipated that the patient will be medically stable for discharge from the hospital within 2 midnights of admission. The following factors support the patient status of inpatient.   " The patient's presenting symptoms include abdominal pain with nausea. " The worrisome physical exam findings include epigastric tenderness. " The initial radiographic and laboratory data are worrisome because of elevated LFTs. " The chronic co-morbidities include IV drug abuse.   * I certify that at the point of  admission it is my clinical judgment that the patient will require inpatient hospital care spanning beyond 2 midnights from the point of admission due to high intensity of service, high risk for further deterioration and high frequency of surveillance required.Barbette Merino MD [facility] Pager [phone]  If 7PM-7AM, please contact night-coverage [email or web address] Password [facility]  02/23/2019, 4:48 AM

## 2019-02-23 NOTE — ED Provider Notes (Signed)
[facility] EMERGENCY DEPARTMENT Provider Note   CSN: [id number] Arrival date & time: 02/22/19  2333    History   Chief Complaint Chief Complaint  Patient presents with  . Abdominal Pain    HPI Victoria Valenzuela is a 33 y.o. female with a history of IV heroin use, cannabis use who presents to the emergency department with a chief complaint of weakness.  The patient endorses generalized weakness, upper abdominal pain, nausea, vomiting, and diarrhea for the last week.  She reports that her eyes have become more yellow over the last few days and her urine has been either brown or orange.  She denies taking Azo.  She reports that she is also been having urinary frequency, malodorous vaginal discharge, and dyspareunia.  She also endorses intermittent subjective fever and chills.  She also endorses shortness of breath, but denies chest pain.  Last episode of vomiting was almost 16 hours ago.  She has a history of IV heroin use, but reports that she overdosed last week.  EMS was called out, but she did not go to the hospital for evaluation at that time.  She reports that she has since stopped using IV heroin.  Last intranasal heroin use was 3 days ago.  She also endorses marijuana use  Denies other recreational or IV drug use.  She denies significant alcohol use.  She does report that she has been taking 3 tablets of Tylenol PM every night.  Denies other Tylenol use.  No known sick contacts with similar symptoms.  She reports that she is sexually active with 2 female partners.  They do not use condoms.  She reports that she does have concern for STIs.  The patient was seen at [facility] earlier tonight.  She was found to have significantly elevated transaminases and alkaline phosphatase.  She was treated with IV potassium chloride, fluids, and Zofran.  She had a right upper quadrant ultrasound that demonstrated a thickened gallbladder wall without evidence of cholecystitis.   No gallstones were present and she had no gallbladder distention.  There was homogenous liver echotexture. Dr. Alessandra Bevels with GI was consulted who recommended hospital admission for supportive care.  Unfortunately, the patient left AMA due to issues with childcare.  She is unsure if she has ever been vaccinated for hepatitis B.  Surgical history includes left tubal ligation secondary to ectopic pregnancy.        The history is provided by the patient. No language interpreter was used.    Past Medical History:  Diagnosis Date  . Hepatitis     Patient Active Problem List   Diagnosis Date Noted  . Hypokalemia 02/23/2019  . PID (acute pelvic inflammatory disease) 02/23/2019  . Heroin abuse ([place]) 02/23/2019    Past Surgical History:  Procedure Laterality Date  . ECTOPIC PREGNANCY SURGERY       OB History   No obstetric history on file.      Home Medications    Prior to Admission medications   Medication Sig Start Date End Date Taking? Authorizing Provider  ibuprofen (ADVIL) 600 MG tablet Take 1 tablet (600 mg total) by mouth every 6 (six) hours as needed. 12/23/18   Julianne Rice, MD    Family History No family history on file.  Social History Social History   Tobacco Use  . Smoking status: Current Every Day Smoker  . Smokeless tobacco: Never Used  Substance Use Topics  . Alcohol use: Yes    Comment:  daily  . Drug use: Yes    Comment: heroin     Allergies   Patient has no known allergies.   Review of Systems Review of Systems  Constitutional: Positive for chills and fever. Negative for activity change.  HENT: Negative for congestion and sore throat.   Respiratory: Negative for shortness of breath and wheezing.   Cardiovascular: Negative for chest pain, palpitations and leg swelling.  Gastrointestinal: Positive for abdominal pain, diarrhea, nausea and vomiting. Negative for anal bleeding, blood in stool, constipation and rectal pain.  Genitourinary:  Positive for dysuria, frequency, pelvic pain, vaginal discharge and vaginal pain. Negative for decreased urine volume, difficulty urinating, flank pain and hematuria.  Musculoskeletal: Negative for back pain, gait problem, joint swelling, myalgias, neck pain and neck stiffness.  Skin: Negative for rash.  Allergic/Immunologic: Negative for immunocompromised state.  Neurological: Positive for weakness (generalized). Negative for syncope, numbness and headaches.  Psychiatric/Behavioral: Negative for agitation and confusion.     Physical Exam Updated Vital Signs BP 115/82 (BP Location: Right Arm)   Pulse 95   Temp 98.4 F (36.9 C) (Oral)   Resp (!) 22   SpO2 96%   Physical Exam Vitals signs and nursing note reviewed.  Constitutional:      General: She is not in acute distress.    Appearance: She is ill-appearing. She is not diaphoretic.  HENT:     Head: Normocephalic.     Mouth/Throat:     Mouth: Mucous membranes are dry.  Eyes:     General: Scleral icterus present.     Conjunctiva/sclera: Conjunctivae normal.  Neck:     Musculoskeletal: Normal range of motion and neck supple.  Cardiovascular:     Rate and Rhythm: Normal rate and regular rhythm.     Pulses: Normal pulses.     Heart sounds: Normal heart sounds. No murmur. No friction rub. No gallop.      Comments: Heart is regular rate and rhythm without murmurs rubs or gallops. Pulmonary:     Effort: Pulmonary effort is normal. No respiratory distress.     Breath sounds: No stridor. No rhonchi or rales.  Chest:     Chest wall: No tenderness.  Abdominal:     General: There is no distension.     Palpations: Abdomen is soft.     Tenderness: There is abdominal tenderness. There is no right CVA tenderness or left CVA tenderness.     Comments: Tender palpation in the epigastric and right upper quadrant.  No rebound or guarding.  Genitourinary:    Comments: Chaperoned exam.  Vaginal wall is erythematous.  She has cervical motion  tenderness.  She has left-sided adnexal tenderness.  No obvious adnexal masses bilaterally.  No right-sided adnexal tenderness.  There is a moderate amount of yellow discharge noted in the vaginal vault. Musculoskeletal:     Right lower leg: No edema.     Left lower leg: No edema.  Skin:    General: Skin is warm.     Capillary Refill: Capillary refill takes less than 2 seconds.     Coloration: Skin is jaundiced.     Findings: No rash.     Comments: No Janeway lesions or Osler nodes.  Neurological:     General: No focal deficit present.     Mental Status: She is alert.     Comments: Alert and oriented x3.  Speaks in complete, fluent sentences.  Psychiatric:        Behavior: Behavior normal.  ED Treatments / Results  Labs (all labs ordered are listed, but only abnormal results are displayed) Labs Reviewed  WET PREP, GENITAL - Abnormal; Notable for the following components:      Result Value   Trich, Wet Prep PRESENT (*)    Clue Cells Wet Prep HPF POC PRESENT (*)    WBC, Wet Prep HPF POC FEW (*)    All other components within normal limits  SARS CORONAVIRUS 2 (HOSPITAL ORDER, PERFORMED IN [facility])  HIV ANTIBODY (ROUTINE TESTING W REFLEX)  RPR  RAPID URINE DRUG SCREEN, HOSP PERFORMED  GC/CHLAMYDIA PROBE AMP ([place]) NOT AT [facility]    EKG None  Radiology [place] Abdomen Limited Ruq  Result Date: 02/22/2019 CLINICAL DATA:  Epigastric pain EXAM: ULTRASOUND ABDOMEN LIMITED RIGHT UPPER QUADRANT COMPARISON:  None. FINDINGS: Gallbladder: thickened gallbladder wall to 7 mm. The gallbladder is nondistended. No gallstones are present. No pericholecystic fluid. Negative sonographic Murphy's sign. Common bile duct: Diameter: Normal at 4 mm Liver: Homogeneous liver echotexture. No duct dilatation. Portal vein is patent on color Doppler imaging with normal direction of blood flow towards the liver. Other: None. IMPRESSION: 1. Thickened gallbladder wall without evidence of  cholecystitis. No gallstones present. No gallbladder distension. 2. Normal common bile duct and liver. Electronically Signed   By: Genevive BiStewart  Edmunds M.D.   On: 02/22/2019 18:38    Procedures Procedures (including critical care time)  Medications Ordered in ED Medications  potassium chloride 10 mEq in 100 mL IVPB (has no administration in time range)  cefoTEtan (CEFOTAN) 2 g in sodium chloride 0.9 % 100 mL IVPB (has no administration in time range)  doxycycline (VIBRA-TABS) tablet 100 mg (has no administration in time range)  metroNIDAZOLE (FLAGYL) tablet 500 mg (has no administration in time range)  sodium chloride 0.9 % bolus 1,000 mL (1,000 mLs Intravenous New Bag/Given 02/23/19 0436)  dicyclomine (BENTYL) injection 20 mg (20 mg Intramuscular Given 02/23/19 0423)  ondansetron (ZOFRAN) injection 4 mg (4 mg Intravenous Given 02/23/19 0415)  potassium chloride SA (K-DUR) CR tablet 40 mEq (40 mEq Oral Given 02/23/19 0406)     Initial Impression / Assessment and Plan / ED Course  I have reviewed the triage vital signs and the nursing notes.  Pertinent labs & imaging results that were available during my care of the patient were reviewed by me and considered in my medical decision making (see chart for details).        33 year old female with a history of IV heroin use, cannabis use presenting with generalized weakness, jaundice, epigastric and right upper quadrant pain, subjective fever and chills, shortness of breath, nausea, vomiting, diarrhea, dark urine, urinary frequency, dysuria, dyspareunia, and malodorous vaginal discharge for the last week.  She is hemodynamically stable and afebrile in the ED.  She was [facility] earlier today where she had labs and a right upper quadrant ultrasound performed.  Unfortunately, she had to leave AMA secondary to childcare issues.  Labs are notable for AST of 730 and ALT of 1120.  Total bilirubin is elevated to 7.2.  Alkaline phosphatase  is 156.  INR is 1.3.  Hepatitis panel is pending.  She has no leukocytosis, leukopenia, or thrombocytopenia.  Right upper quadrant ultrasound demonstrated thickened gallbladder wall without evidence of cholecystitis.  There were no gallstones present and no gallbladder distention.  Liver appeared homogenous.  Normal common bile duct and liver.  Patient does report that she has been taking 3 tablets  of Tylenol PM nightly.  Denies other Tylenol use.  She reports infrequent alcohol use.  Until last week, she was using IV heroin, but stopped after an accidental overdose, which puts her more at risk for hepatitis B or an acute exacerbation of hepatitis C.  She is unsure if she was ever vaccinated for hepatitis B.  Only 6 cases of hepatitis [place] been [place] since [place].  No known outbreaks.  Less likely autoimmune hepatitis given her age and medical history.  The patient is also endorsing genitourinary symptoms.  Pregnancy test was negative.  UA is positive for nitrates and trace leukocyte esterase.  There is also a large amount of bilirubinuria.  Urine culture sent.  Wet prep is positive for trichomonas and clue cells.  Will initiate Flagyl 500 mg twice daily to cover for both bacterial vaginosis and trichomoniasis.  Gonorrhea,  Chlamydia, HIV, and syphilis testing are pending.  On pelvic exam, patient has cervical motion tenderness and left adnexal tenderness.  I think that it is reasonable to treat the patient for PID given severity of symptoms and cervical motion tenderness.  Will initiate IV Cefotan and oral doxycycline.  Considered Fitz-Hugh Curtis syndrome as the source of the patient's hepatitis; however, transaminitis is significantly elevated, which is not common with Fitz-Hugh Curtis syndrome.  Since she does have left adnexal tenderness, will order pelvic ultrasound to further assess for TOA.   Consult to the hospitalist team for admission and spoke with Dr. Mikeal HawthorneGarba who will accept  the patient for admission.  I did not reconsult gastroenterology at this time given the recommendations for supportive care when consulted by PA Gekas earlier tonight.   The patient appears reasonably stabilized for admission considering the current resources, flow, and capabilities available in the ED at this time, and I doubt any other [facility] requiring further screening and/or treatment in the ED prior to admission.  Final Clinical Impressions(s) / ED Diagnoses   Final diagnoses:  Hepatitis  Pelvic inflammatory disease  Jaundice  IV drug abuse [facility](HCC)    ED Discharge Orders    None       Barkley BoardsMcDonald, Srinidhi Landers A, PA-C 02/23/19 0438    Dione BoozeGlick, David, MD 02/23/19 [phone]

## 2019-02-23 NOTE — ED Notes (Signed)
Unable to locate pt. at waiting area by RN/NT.

## 2019-02-23 NOTE — ED Notes (Signed)
ED TO INPATIENT HANDOFF REPORT  ED Nurse Name and Phone #: [phone], [place] RN  S Name/Age/Gender Victoria Valenzuela 33 y.o. female Room/Bed: 034C/034C  Code Status   Code Status: Not on file  Home/SNF/Other Home Patient oriented to: self, place, time and situation Is this baseline? Yes   Triage Complete: Triage complete  Chief Complaint Liver failure  Triage Note Patient reports upper abdominal pain for 4 days with emesis and diarrhea , denies fever or chills , seen at [facility] ER this afternoon - blood tests , urine test and ultrasound done at [facility].    Allergies No Known Allergies  Level of Care/Admitting Diagnosis ED Disposition    ED Disposition Condition Comment   Admit  Hospital Area: [facility] [[id number]]  Level of Care: Med-Surg [16]  Covid Evaluation: Asymptomatic Screening Protocol (No Symptoms)  Diagnosis: Acute hepatitis [371062]  Admitting Physician: Elwyn Reach [[id number]]  Attending Physician: Elwyn Reach [[id number]]  Estimated length of stay: past midnight tomorrow  Certification:: I certify this patient will need inpatient services for at least 2 midnights  PT Class (Do Not Modify): Inpatient [101]  PT Acc Code (Do Not Modify): Private [1]       B Medical/Surgery History Past Medical History:  Diagnosis Date  . Hepatitis    Past Surgical History:  Procedure Laterality Date  . ECTOPIC PREGNANCY SURGERY       A IV Location/Drains/Wounds Patient Lines/Drains/Airways Status   Active Line/Drains/Airways    Name:   Placement date:   Placement time:   Site:   Days:   Peripheral IV 02/23/19 Right Antecubital   02/23/19    0319    Antecubital   less than 1          Intake/Output Last 24 hours No intake or output data in the 24 hours ending 02/23/19 0457  Labs/Imaging Results for orders placed or performed during the hospital encounter of 02/22/19 (from the past 48 hour(s))  Wet prep, genital     Status: Abnormal   Collection Time: 02/23/19  3:40 AM   Specimen: Thin Prep Cervical/Endocervical  Result Value Ref Range   Yeast Wet Prep HPF POC NONE SEEN NONE SEEN   Trich, Wet Prep PRESENT (A) NONE SEEN   Clue Cells Wet Prep HPF POC PRESENT (A) NONE SEEN   WBC, Wet Prep HPF POC FEW (A) NONE SEEN   Sperm NONE SEEN     Comment: Performed at [facility] [address], [place], [place] [address]   US Abdomen Limited Ruq  Result Date: 02/22/2019 CLINICAL DATA:  Epigastric pain EXAM: ULTRASOUND ABDOMEN LIMITED RIGHT UPPER QUADRANT COMPARISON:  None. FINDINGS: Gallbladder: thickened gallbladder wall to 7 mm. The gallbladder is nondistended. No gallstones are present. No pericholecystic fluid. Negative sonographic Murphy's sign. Common bile duct: Diameter: Normal at 4 mm Liver: Homogeneous liver echotexture. No duct dilatation. Portal vein is patent on color Doppler imaging with normal direction of blood flow towards the liver. Other: None. IMPRESSION: 1. Thickened gallbladder wall without evidence of cholecystitis. No gallstones present. No gallbladder distension. 2. Normal common bile duct and liver. Electronically Signed   By: Suzy Bouchard M.D.   On: 02/22/2019 18:38    Pending Labs Unresulted Labs (From admission, onward)    Start     Ordered   02/23/19 0352  SARS Coronavirus 2 [facility] order, Performed in [facility] hospital lab) Nasopharyngeal Nasopharyngeal Swab  (Symptomatic/High Risk of Exposure Patients Labs with Precautions)  Once,  STAT    Question Answer Comment  Is this test for diagnosis or screening Diagnosis of ill patient   Symptomatic for COVID-19 as defined by CDC Yes   Date of Symptom Onset 02/16/2019   Hospitalized for COVID-19 No   Admitted to ICU for COVID-19 No   Previously tested for COVID-19 No   Resident in a congregate (group) care setting No   Employed in healthcare setting No   Pregnant No      02/23/19 0351   02/23/19 0345  Rapid urine drug screen (hospital  performed)  ONCE - STAT,   STAT     02/23/19 0344   02/23/19 0330  HIV Antibody (routine testing w rflx)  Once,   STAT     02/23/19 0329   02/23/19 0330  RPR  ONCE - STAT,   STAT     02/23/19 0329   Signed and Held  Comprehensive metabolic panel  Tomorrow morning,   R     Signed and Held   Signed and Held  CBC  Tomorrow morning,   R     Signed and Held          Vitals/Pain Today's Vitals   02/22/19 2351 02/22/19 2353  BP: 115/82   Pulse: 95   Resp:  (!) 22  Temp: 98.4 F (36.9 C)   TempSrc: Oral   SpO2: 96%   PainSc:  8     Isolation Precautions No active isolations  Medications Medications  potassium chloride 10 mEq in 100 mL IVPB (10 mEq Intravenous New Bag/Given 02/23/19 0439)  cefoTEtan (CEFOTAN) 2 g in sodium chloride 0.9 % 100 mL IVPB (has no administration in time range)  doxycycline (VIBRA-TABS) tablet 100 mg (has no administration in time range)  metroNIDAZOLE (FLAGYL) tablet 500 mg (has no administration in time range)  sodium chloride 0.9 % bolus 1,000 mL (1,000 mLs Intravenous New Bag/Given 02/23/19 0436)  dicyclomine (BENTYL) injection 20 mg (20 mg Intramuscular Given 02/23/19 0423)  ondansetron (ZOFRAN) injection 4 mg (4 mg Intravenous Given 02/23/19 0415)  potassium chloride SA (K-DUR) CR tablet 40 mEq (40 mEq Oral Given 02/23/19 0406)    Mobility walks Low fall risk   Focused Assessments gi   R Recommendations: See Admitting Provider Note  Report given to:   Additional Notes:

## 2019-02-23 NOTE — Progress Notes (Signed)
Per HPI: Victoria Valenzuela is a 33 y.o. female with medical history significant of IV drug abuse mainly heroin, cannabis use, high risk sexual encounters who presented to [facility] initially with complaint of upper abdominal pain nausea vomiting and diarrhea for about a week.  Patient noted gradual yellow discoloration of her eyes.  Also malaise.  She also noted increased urinary frequency and vaginal discharge which was mild address.  She had dyspareunia.  In the last 1 week she has had fever on and off with some chills.  She has been sexually active with at least 2 partners.  She has also used heroin for a number of years.  Her last use was about a week ago.  She is now using intranasal instead of injected.  Patient is social alcohol drinker but takes Tylenol PM every night.  No exposure to anybody was COVID-19.  She was seen at the [facility] initially and found to have markedly elevated liver transaminases.  Work-up with ultrasound of the liver showed no evidence of obstructive jaundice with gallbladder thickening but no evidence of cholecystitis.  Acute hepatitis secondary to liver injury or viral hepatitis was suspected.  GI was consulted but patient left AGAINST MEDICAL ADVICE.  She is came to [facility] now with the same complaints.  Her acute otitis panel still pending.  Patient had pelvic exam done showing significant adnexal tenderness with malodorous discharge.  She is positive for bacterial vaginosis and trichomoniasis.  Patient has been initiated on IV Cefotan and doxycycline.  Also Flagyl in the ER and she has been admitted for further work-up.  Patient seen and evaluated at bedside with no symptomatic complaints or concerns noted this morning.  She is noted to have acute hepatitis and I have ordered viral hepatitis panel as this was not ordered on admission.  Repeat labs are currently pending.  Right upper quadrant ultrasound with some gallbladder wall thickening, but no other  signs of cholecystitis noted.  Patient does drink a whole bottle of hard liquor on a weekly basis and does take Tylenol, however levels are currently low.  She is also noted to have bacterial vaginosis as well as trichomoniasis with high suspicion for PID and has received 1 IV dose of Cefotetan and has been started on oral Flagyl and doxycycline for which she will need to complete a 14-day course.  I will consult gastroenterology to assist with further evaluation.  Replete potassium as needed and recheck labs in a.m.  Total care time: 35 minutes.

## 2019-02-23 NOTE — Consult Note (Signed)
Referring Provider:   Internal Medicine Teaching Service        Primary Care Physician:  Patient, No Pcp Per Primary Gastroenterologist: unassigned           Reason for Consultation:   Liver failure                ASSESSMENT /  PLAN    1. 33 yo female with polysubstance abuse in form of IV, intranasal and pills presenting with a week of abdominal pain /nausea / vomiting / diarrhea and malaise. Liver tests markedly elevated in mixed pattern  RUQ unrevealing.  -Rule out acute viral hepatitis. Doubt autoimmune / genetic liver disease but studies are pending.  -Takes tylenol on daily basis. Pharmacy consulted for NEC.  -supportive care  -monitor for encephalopathy, worsening coagulopathy, hypoglycemia and other signs / symptoms of ALF. She is sleepy at present but A+O, no asterixis.   2. STD  - bacterial vaginosis and trichomoniasis. On antibiotics now.    HPI:     Victoria Passeymanda Devery is a 33 y.o. female with a history of IVDA who presented to ED abdominal pain / fatigues / nausea / vomiting and diarrhea over the last week.. [facility], left A[facility] then [facility] with same complaints. Found to markedly abnormal liver tests in mixed pattern. Patient says she uses multiple drug / IV, intranasal and oral. She has recently had unprotected sex. Prior to now she has had no known liver problems. She takes tylenol pm 1-2 a night. She sleepy and gives limited history.   PREVIOUS GASTROINTESTINAL STUDIES  none  Past Medical History:  Diagnosis Date   Hepatitis     Past Surgical History:  Procedure Laterality Date   ECTOPIC PREGNANCY SURGERY      Prior to Admission medications   Medication Sig Start Date End Date Taking? Authorizing Provider  ibuprofen (ADVIL) 600 MG tablet Take 1 tablet (600 mg total) by mouth every 6 (six) hours as needed. 12/23/18   Loren RacerYelverton, David, MD    Current Facility-Administered Medications  Medication Dose Route Frequency Provider Last Rate Last Dose    dextrose 5 %-0.9 % sodium chloride infusion   Intravenous Continuous Earlie LouGarba, Mohammad L, MD 100 mL/hr at 02/23/19 0634     doxycycline (VIBRA-TABS) tablet 100 mg  100 mg Oral Q12H Garba, Mohammad L, MD   100 mg at 02/23/19 0536   ketorolac (TORADOL) 30 MG/ML injection 30 mg  30 mg Intravenous Q6H PRN Earlie LouGarba, Mohammad L, MD   30 mg at 02/23/19 [id number]   metroNIDAZOLE (FLAGYL) tablet 500 mg  500 mg Oral Q12H Earlie LouGarba, Mohammad L, MD   500 mg at 02/23/19 0923   ondansetron (ZOFRAN) tablet 4 mg  4 mg Oral Q6H PRN Rometta EmeryGarba, Mohammad L, MD       Or   ondansetron (ZOFRAN) injection 4 mg  4 mg Intravenous Q6H PRN Rometta EmeryGarba, Mohammad L, MD        Allergies as of 02/22/2019   (No Known Allergies)    No family history on file.  Social History   Socioeconomic History   Marital status: Single    Spouse name: Not on file   Number of children: Not on file   Years of education: Not on file   Highest education level: Not on file  Occupational History   Not on file  Social Needs   Financial resource strain: Not on file   Food insecurity    Worry: Not on file  Inability: Not on file   Transportation needs    Medical: Not on file    Non-medical: Not on file  Tobacco Use   Smoking status: Current Every Day Smoker   Smokeless tobacco: Never Used  Substance and Sexual Activity   Alcohol use: Yes    Comment: daily   Drug use: Yes    Comment: heroin   Sexual activity: Not on file  Lifestyle   Physical activity    Days per week: Not on file    Minutes per session: Not on file   Stress: Not on file  Relationships   Social connections    Talks on phone: Not on file    Gets together: Not on file    Attends religious service: Not on file    Active member of club or organization: Not on file    Attends meetings of clubs or organizations: Not on file    Relationship status: Not on file   Intimate partner violence    Fear of current or ex partner: Not on file    Emotionally abused:  Not on file    Physically abused: Not on file    Forced sexual activity: Not on file  Other Topics Concern   Not on file  Social History Narrative   Not on file    Review of Systems: All systems reviewed and negative except where noted in HPI.  Physical Exam: Vital signs in last 24 hours: Temp:  [98.4 F (36.9 C)-99.3 F (37.4 C)] 99.3 F (37.4 C) (08/02 0614) Pulse Rate:  [65-95] 71 (08/02 0614) Resp:  [16-22] 16 (08/02 0614) BP: (102-128)/(54-82) 102/54 (08/02 0614) SpO2:  [96 %-100 %] 98 % (08/02 0614) Weight:  [81.2 kg-81.6 kg] 81.2 kg (08/02 0614) Last BM Date: 02/22/19 General:   Sleepy well developed female in NAD Psych:   Cooperative. Eyes:  Pupils equal, sclera clear, no icterus.   Conjunctiva pink. Ears:  Normal auditory acuity. Nose:  No deformity, discharge,  or lesions. Neck:  Supple; no masses Lungs:  Clear throughout to auscultation.   No wheezes, crackles, or rhonchi.  Heart:  Regular rate and rhythm; no murmurs, no lower extremity edema Abdomen:  Soft, non-distended, mild -mod RUQ tenderness. BS active, no palp mass   Rectal:  Deferred  Msk:  Symmetrical without gross deformities. . Neurologic:  Alert and  oriented x4;  grossly normal neurologically. Skin:  Intact without significant lesions or rashes.   Intake/Output from previous day: No intake/output data recorded. Intake/Output this shift: No intake/output data recorded.  Lab Results: Recent Labs    02/22/19 1704 02/23/19 0957  WBC 4.9 4.7  HGB 12.7 11.3*  HCT 39.5 33.3*  PLT 313 251   BMET Recent Labs    02/22/19 1704 02/23/19 0957  NA 135 137  K 3.0* 3.4*  CL 99 106  CO2 27 23  GLUCOSE 96 107*  BUN 5* <5*  CREATININE 0.67 0.65  CALCIUM 8.7* 8.2*   LFT Recent Labs    02/23/19 0957  PROT 5.8*  ALBUMIN 2.5*  AST 318*  ALT 730*  ALKPHOS 170*  BILITOT 5.4*   PT/INR Recent Labs    02/22/19 1939 02/23/19 0957  LABPROT 15.6* 15.9*  INR 1.3* 1.3*   Hepatitis  Panel No results for input(s): HEPBSAG, HCVAB, HEPAIGM, HEPBIGM in the last 72 hours.   . CBC Latest Ref Rng & Units 02/23/2019 02/22/2019  WBC 4.0 - 10.5 K/uL 4.7 4.9  Hemoglobin 12.0 - 15.0 g/dL  11.3(L) 12.7  Hematocrit 36.0 - 46.0 % 33.3(L) 39.5  Platelets 150 - 400 K/uL 251 313    . CMP Latest Ref Rng & Units 02/23/2019 02/22/2019  Glucose 70 - 99 mg/dL 161(W107(H) 96  BUN 6 - 20 mg/dL <9(U<5(L) 5(L)  Creatinine 0.44 - 1.00 mg/dL 0.450.65 4.090.67  Sodium 811135 - 145 mmol/L 137 135  Potassium 3.5 - 5.1 mmol/L 3.4(L) 3.0(L)  Chloride 98 - 111 mmol/L 106 99  CO2 22 - 32 mmol/L 23 27  Calcium 8.9 - 10.3 mg/dL 8.2(L) 8.7(L)  Total Protein 6.5 - 8.1 g/dL 9.1(Y5.8(L) 6.9  Total Bilirubin 0.3 - 1.2 mg/dL 7.8(G5.4(H) 7.2(H)  Alkaline Phos 38 - 126 U/L 170(H) 156(H)  AST 15 - 41 U/L 318(H) 730(H)  ALT 0 - 44 U/L 730(H) 1,120(H)   Studies/Results: Dg Chest 2 View  Result Date: 02/23/2019 CLINICAL DATA:  Patient with generalized weakness and abdominal pain. Urinary frequency. EXAM: CHEST - 2 VIEW COMPARISON:  None. FINDINGS: Normal cardiac and mediastinal contours. Hazy opacity over the left mid and lower lung favored to be secondary to overlying soft tissue. No large area pulmonary consolidation. No pleural effusion or pneumothorax. Regional skeleton is unremarkable. IMPRESSION: Hazy opacity left mid lung favored to be secondary to overlapping soft tissue. Consider PA chest radiograph. Otherwise no acute cardiopulmonary process. Electronically Signed   By: Annia Beltrew  Davis M.D.   On: 02/23/2019 05:55   [place] Transvaginal Non-ob  Result Date: 02/23/2019 CLINICAL DATA:  Left adnexal pain. EXAM: TRANSABDOMINAL AND TRANSVAGINAL ULTRASOUND OF PELVIS DOPPLER ULTRASOUND OF OVARIES TECHNIQUE: Both transabdominal and transvaginal ultrasound examinations of the pelvis were performed. Transabdominal technique was performed for global imaging of the pelvis including uterus, ovaries, adnexal regions, and pelvic cul-de-sac. It was necessary to  proceed with endovaginal exam following the transabdominal exam to visualize the adnexal structures. Color and duplex Doppler ultrasound was utilized to evaluate blood flow to the ovaries. COMPARISON:  None. FINDINGS: Uterus Measurements: 9.5 x 4.3 x 5.8 cm = volume: 120.7 mL. No fibroids or other mass visualized. Endometrium Thickness: 11 mm.  No focal abnormality visualized. Right ovary Measurements: 3.7 x 1.9 x 2.4 cm = volume: 8.4 mL. Normal appearance/no adnexal mass. Left ovary Measurements: 4.0 x 2.3 x 3.4 cm = volume: 16.6 mL. Normal appearance/no adnexal mass. Corpus luteum left ovary. Pulsed Doppler evaluation of both ovaries demonstrates normal low-resistance arterial and venous waveforms. Other findings Trace pelvic fluid. IMPRESSION: No acute process within the pelvis. No sonographic evidence to suggest torsion. Electronically Signed   By: Annia Beltrew  Davis M.D.   On: 02/23/2019 05:22   [place] Pelvis Complete  Result Date: 02/23/2019 CLINICAL DATA:  Left adnexal pain. EXAM: TRANSABDOMINAL AND TRANSVAGINAL ULTRASOUND OF PELVIS DOPPLER ULTRASOUND OF OVARIES TECHNIQUE: Both transabdominal and transvaginal ultrasound examinations of the pelvis were performed. Transabdominal technique was performed for global imaging of the pelvis including uterus, ovaries, adnexal regions, and pelvic cul-de-sac. It was necessary to proceed with endovaginal exam following the transabdominal exam to visualize the adnexal structures. Color and duplex Doppler ultrasound was utilized to evaluate blood flow to the ovaries. COMPARISON:  None. FINDINGS: Uterus Measurements: 9.5 x 4.3 x 5.8 cm = volume: 120.7 mL. No fibroids or other mass visualized. Endometrium Thickness: 11 mm.  No focal abnormality visualized. Right ovary Measurements: 3.7 x 1.9 x 2.4 cm = volume: 8.4 mL. Normal appearance/no adnexal mass. Left ovary Measurements: 4.0 x 2.3 x 3.4 cm = volume: 16.6 mL. Normal appearance/no adnexal mass. Corpus luteum left ovary.  Pulsed  Doppler evaluation of both ovaries demonstrates normal low-resistance arterial and venous waveforms. Other findings Trace pelvic fluid. IMPRESSION: No acute process within the pelvis. No sonographic evidence to suggest torsion. Electronically Signed   By: Lovey Newcomer M.D.   On: 02/23/2019 05:22   [place] Art/ven Flow Abd Pelv Doppler  Result Date: 02/23/2019 CLINICAL DATA:  Left adnexal pain. EXAM: TRANSABDOMINAL AND TRANSVAGINAL ULTRASOUND OF PELVIS DOPPLER ULTRASOUND OF OVARIES TECHNIQUE: Both transabdominal and transvaginal ultrasound examinations of the pelvis were performed. Transabdominal technique was performed for global imaging of the pelvis including uterus, ovaries, adnexal regions, and pelvic cul-de-sac. It was necessary to proceed with endovaginal exam following the transabdominal exam to visualize the adnexal structures. Color and duplex Doppler ultrasound was utilized to evaluate blood flow to the ovaries. COMPARISON:  None. FINDINGS: Uterus Measurements: 9.5 x 4.3 x 5.8 cm = volume: 120.7 mL. No fibroids or other mass visualized. Endometrium Thickness: 11 mm.  No focal abnormality visualized. Right ovary Measurements: 3.7 x 1.9 x 2.4 cm = volume: 8.4 mL. Normal appearance/no adnexal mass. Left ovary Measurements: 4.0 x 2.3 x 3.4 cm = volume: 16.6 mL. Normal appearance/no adnexal mass. Corpus luteum left ovary. Pulsed Doppler evaluation of both ovaries demonstrates normal low-resistance arterial and venous waveforms. Other findings Trace pelvic fluid. IMPRESSION: No acute process within the pelvis. No sonographic evidence to suggest torsion. Electronically Signed   By: Lovey Newcomer M.D.   On: 02/23/2019 05:22   US Abdomen Limited Ruq  Result Date: 02/22/2019 CLINICAL DATA:  Epigastric pain EXAM: ULTRASOUND ABDOMEN LIMITED RIGHT UPPER QUADRANT COMPARISON:  None. FINDINGS: Gallbladder: thickened gallbladder wall to 7 mm. The gallbladder is nondistended. No gallstones are present. No pericholecystic  fluid. Negative sonographic Murphy's sign. Common bile duct: Diameter: Normal at 4 mm Liver: Homogeneous liver echotexture. No duct dilatation. Portal vein is patent on color Doppler imaging with normal direction of blood flow towards the liver. Other: None. IMPRESSION: 1. Thickened gallbladder wall without evidence of cholecystitis. No gallstones present. No gallbladder distension. 2. Normal common bile duct and liver. Electronically Signed   By: Suzy Bouchard M.D.   On: 02/22/2019 18:38    Principal Problem:   Acute hepatitis Active Problems:   Hypokalemia   PID (acute pelvic inflammatory disease)   Heroin abuse ([place])    Tye Savoy, NP-C @  02/23/2019, 10:56 AM

## 2019-02-23 NOTE — Progress Notes (Signed)
Pt requesting med to help her sleep. On  call [facility] clinician paged.

## 2019-02-23 NOTE — Progress Notes (Signed)
MEDICATION RELATED CONSULT NOTE - FOLLOW UP   Pharmacy Consult for acetylcysteine Indication: liver failure, regular Tylenol use  No Known Allergies  Patient Measurements: Height: 5\' 9"  (175.3 cm) Weight: 179 lb 0.2 oz (81.2 kg) IBW/kg (Calculated) : 66.2 Adjusted Body Weight:  Vital Signs: Temp: 99.3 F (37.4 C) (08/02 0614) Temp Source: Oral (08/02 0614) BP: 102/54 (08/02 1324) Pulse Rate: 71 (08/02 0614) Intake/Output from previous day: No intake/output data recorded. Intake/Output from this shift: No intake/output data recorded.  Labs: Recent Labs    02/22/19 1704 02/23/19 0957  WBC 4.9 4.7  HGB 12.7 11.3*  HCT 39.5 33.3*  PLT 313 251  CREATININE 0.67 0.65  ALBUMIN 3.2* 2.5*  PROT 6.9 5.8*  AST 730* 318*  ALT 1,120* 730*  ALKPHOS 156* 170*  BILITOT 7.2* 5.4*   Estimated Creatinine Clearance: 117.2 mL/min (by C-G formula based on SCr of 0.65 mg/dL).   Microbiology: Recent Results (from the past 720 hour(s))  Wet prep, genital     Status: Abnormal   Collection Time: 02/23/19  3:40 AM   Specimen: Thin Prep Cervical/Endocervical  Result Value Ref Range Status   Yeast Wet Prep HPF POC NONE SEEN NONE SEEN Final   Trich, Wet Prep PRESENT (A) NONE SEEN Final   Clue Cells Wet Prep HPF POC PRESENT (A) NONE SEEN Final   WBC, Wet Prep HPF POC FEW (A) NONE SEEN Final   Sperm NONE SEEN  Final    Comment: Performed at [facility], [place] [address], [place], [place] [address]  SARS Coronavirus 2 [facility] order, Performed in [facility] hospital lab) Nasopharyngeal Nasopharyngeal Swab     Status: None   Collection Time: 02/23/19  5:34 AM   Specimen: Nasopharyngeal Swab  Result Value Ref Range Status   SARS Coronavirus 2 NEGATIVE NEGATIVE Final    Comment: (NOTE) If result is NEGATIVE SARS-CoV-2 target nucleic acids are NOT DETECTED. The SARS-CoV-2 RNA is generally detectable in upper and lower  respiratory specimens during the acute phase of infection. The  lowest  concentration of SARS-CoV-2 viral copies this assay can detect is 250  copies / mL. A negative result does not preclude SARS-CoV-2 infection  and should not be used as the sole basis for treatment or other  patient management decisions.  A negative result may occur with  improper specimen collection / handling, submission of specimen other  than nasopharyngeal swab, presence of viral mutation(s) within the  areas targeted by this assay, and inadequate number of viral copies  (<250 copies / mL). A negative result must be combined with clinical  observations, patient history, and epidemiological information. If result is POSITIVE SARS-CoV-2 target nucleic acids are DETECTED. The SARS-CoV-2 RNA is generally detectable in upper and lower  respiratory specimens dur ing the acute phase of infection.  Positive  results are indicative of active infection with SARS-CoV-2.  Clinical  correlation with patient history and other diagnostic information is  necessary to determine patient infection status.  Positive results do  not rule out bacterial infection or co-infection with other viruses. If result is PRESUMPTIVE POSTIVE SARS-CoV-2 nucleic acids MAY BE PRESENT.   A presumptive positive result was obtained on the submitted specimen  and confirmed on repeat testing.  While 2019 novel coronavirus  (SARS-CoV-2) nucleic acids may be present in the submitted sample  additional confirmatory testing may be necessary for epidemiological  and / or clinical management purposes  to differentiate between  SARS-CoV-2 and other Sarbecovirus currently  known to infect humans.  If clinically indicated additional testing with an alternate test  methodology [phone]([id number]) is advised. The SARS-CoV-2 RNA is generally  detectable in upper and lower respiratory sp ecimens during the acute  phase of infection. The expected result is Negative. Fact Sheet for Patients:   BoilerBrush.com.cyhttps://www.fda.gov/media/136312/download Fact Sheet for Healthcare Providers: https://pope.com/https://www.fda.gov/media/136313/download This test is not yet approved or cleared by the [place] FDA and has been authorized for detection and/or diagnosis of SARS-CoV-2 by FDA under an Emergency Use Authorization (EUA).  This EUA will remain in effect (meaning this test can be used) for the duration of the COVID-19 declaration under Section 564(b)(1) of the Act, 21 U.S.C. section 360bbb-3(b)(1), unless the authorization is terminated or revoked sooner. Performed at [facility], [address] [place], [place] [address]     Medications:  Medications Prior to Admission  Medication Sig Dispense Refill Last Dose  . ibuprofen (ADVIL) 600 MG tablet Take 1 tablet (600 mg total) by mouth every 6 (six) hours as needed. 30 tablet 0     Assessment: 33 y/o female with polysubstance abuse who [facility] with abdominal pain, vomiting, diarrhea and yellowing of eyes for 1 week but left AMA. [facility] that [facility] for further evaluation. Pharmacy consulted to dose acetylcysteine for liver failure with daily Tylenol use. GI consulted. R/o viral hepatitis.  AST 730 > 318 ALT 1120 > 730 Tbili 7.2 > 5.4 Acetaminophen level <10 INR 1.3  Goal of Therapy:  Return of normal LFT  Plan:  Acetylcysteine 150 mg/kg IV load then 15 mg/kg/hr F/U LOT, labs Discussed case with Poison Control who will check on patient daily F/U GI recs   Thank you for involving pharmacy in this patient's care.  Loura BackJennifer [place], PharmD, BCPS Clinical Pharmacist Clinical phone for 02/23/2019 until 3p is [phone] 02/23/2019 12:15 PM  **Pharmacist phone directory can be found on [email or web address] listed under [facility]**

## 2019-02-24 DIAGNOSIS — B159 Hepatitis A without hepatic coma: Secondary | ICD-10-CM

## 2019-02-24 DIAGNOSIS — F1910 Other psychoactive substance abuse, uncomplicated: Secondary | ICD-10-CM

## 2019-02-24 DIAGNOSIS — B1920 Unspecified viral hepatitis C without hepatic coma: Secondary | ICD-10-CM

## 2019-02-24 DIAGNOSIS — B179 Acute viral hepatitis, unspecified: Secondary | ICD-10-CM

## 2019-02-24 LAB — HEPATITIS PANEL, ACUTE
HCV Ab: >11 s/co ratio — ABNORMAL HIGH (ref 0.0–0.9)
HCV Ab: >11 s/co ratio — ABNORMAL HIGH (ref 0.0–0.9)
Hep A IgM: POSITIVE — AB
Hep A IgM: POSITIVE — AB
Hep B C IgM: NEGATIVE
Hep B C IgM: NEGATIVE
Hepatitis B Surface Ag: NEGATIVE
Hepatitis B Surface Ag: NEGATIVE

## 2019-02-24 LAB — ANTI-SMOOTH MUSCLE ANTIBODY, IGG: F-Actin IgG: 16 Units (ref 0–19)

## 2019-02-24 LAB — CBC
HCT: 35.8 % — ABNORMAL LOW (ref 36.0–46.0)
Hemoglobin: 12.4 g/dL (ref 12.0–15.0)
MCH: 29.1 pg (ref 26.0–34.0)
MCHC: 34.6 g/dL (ref 30.0–36.0)
MCV: 84 fL (ref 80.0–100.0)
Platelets: 291 10*3/uL (ref 150–400)
RBC: 4.26 MIL/uL (ref 3.87–5.11)
RDW: 16.1 % — ABNORMAL HIGH (ref 11.5–15.5)
WBC: 5.8 10*3/uL (ref 4.0–10.5)
nRBC: 0 % (ref 0.0–0.2)

## 2019-02-24 LAB — URINE CULTURE: Culture: 10000 — AB

## 2019-02-24 LAB — COMPREHENSIVE METABOLIC PANEL
ALT: 596 U/L — ABNORMAL HIGH (ref 0–44)
AST: 186 U/L — ABNORMAL HIGH (ref 15–41)
Albumin: 2.4 g/dL — ABNORMAL LOW (ref 3.5–5.0)
Alkaline Phosphatase: 190 U/L — ABNORMAL HIGH (ref 38–126)
Anion gap: 8 (ref 5–15)
BUN: <5 mg/dL — ABNORMAL LOW (ref 6–20)
CO2: 22 mmol/L (ref 22–32)
Calcium: 8.5 mg/dL — ABNORMAL LOW (ref 8.9–10.3)
Chloride: 109 mmol/L (ref 98–111)
Creatinine, Ser: 0.68 mg/dL (ref 0.44–1.00)
GFR calc Af Amer: >60 mL/min (ref >60)
GFR calc non Af Amer: >60 mL/min (ref >60)
Glucose, Bld: 91 mg/dL (ref 70–99)
Potassium: 3.6 mmol/L (ref 3.5–5.1)
Sodium: 139 mmol/L (ref 135–145)
Total Bilirubin: 4.8 mg/dL — ABNORMAL HIGH (ref 0.3–1.2)
Total Protein: 6.1 g/dL — ABNORMAL LOW (ref 6.5–8.1)

## 2019-02-24 LAB — HEPATITIS B CORE ANTIBODY, TOTAL: Hep B Core Total Ab: NEGATIVE

## 2019-02-24 LAB — PROTIME-INR
INR: 1.3 — ABNORMAL HIGH (ref 0.8–1.2)
Prothrombin Time: 15.6 seconds — ABNORMAL HIGH (ref 11.4–15.2)

## 2019-02-24 LAB — ANA W/REFLEX IF POSITIVE: Anti Nuclear Antibody (ANA): NEGATIVE

## 2019-02-24 LAB — MITOCHONDRIAL ANTIBODIES: Mitochondrial M2 Ab, IgG: <20 Units (ref 0.0–20.0)

## 2019-02-24 LAB — HEPATITIS B SURFACE ANTIBODY, QUANTITATIVE: Hep B S AB Quant (Post): 4 m[IU]/mL — ABNORMAL LOW (ref >9.9)

## 2019-02-24 LAB — HEPATITIS A ANTIBODY, TOTAL: hep A Total Ab: POSITIVE — AB

## 2019-02-24 MED ORDER — TRAZODONE HCL 50 MG PO TABS
50.0000 mg | ORAL_TABLET | Freq: Once | ORAL | Status: AC
  Administered 2019-02-24: 22:00:00 50 mg via ORAL
  Filled 2019-02-24: qty 1

## 2019-02-24 NOTE — Progress Notes (Signed)
PROGRESS NOTE    Victoria Valenzuela  JAS:[id number] DOB: Oct 11, 1985 DOA: 02/22/2019 PCP: Patient, No Pcp Per   Brief Narrative:  Per HPI: Victoria Valenzuela a 33 y.o.femalewith medical history significant ofIV drug abuse mainly heroin, cannabis use, high risk sexual encounters who presented to [facility] initially with complaint of upper abdominal pain nausea vomiting and diarrhea for about a week. Patient noted gradual yellow discoloration of her eyes. Also malaise. She also noted increased urinary frequency and vaginal discharge which was mild address. She had dyspareunia. In the last 1 week she has had fever on and off with some chills. She has been sexually active with at least 2 partners. She has also used heroin for a number of years. Her last use was about a week ago. She is now using intranasal instead of injected. Patient is social alcohol drinker but takes Tylenol PM every night. No exposure to anybody was COVID-19. She was seen at the [facility] initially and found to have markedly elevated liver transaminases. Work-up with ultrasound of the liver showed no evidence of obstructive jaundice with gallbladder thickening but no evidence of cholecystitis. Acute hepatitis secondary to liver injury or viral hepatitis was suspected. GI was consulted but patient left AGAINST MEDICAL ADVICE. She is came to [facility] now with the same complaints. Her acute otitis panel still pending. Patient had pelvic exam done showing significant adnexal tenderness with malodorous discharge. She is positive for bacterial vaginosis and trichomoniasis. Patient has been initiated on IV Cefotan and doxycycline. Also Flagyl in the ER and she has been admitted for further work-up.  8/3: LFTs are downtrending and she is noted to be positive for hepatitis A and also has positive hep C antibodies. receiving ongoing treatment for bacterial vaginosis and trichomoniasis with Flagyl and  doxycycline.  Appreciate GI recommendations.  Will stop Acetadote at at this time given poison control recommendations.  Assessment & Plan:   Principal Problem:   Acute hepatitis Active Problems:   Hypokalemia   PID (acute pelvic inflammatory disease)   Heroin abuse ([place])   Acute hepatitis A with associated transaminitis-downtrending -Appreciate GI recommendations -Continue to follow CMP in a.m. and ensure stability prior to discharge -Acetadote discontinued today per poison control recommendations  PID/bacterial vaginosis/trichomoniasis -Received IV cefotetan in ED -Continue on doxycycline and Flagyl for total 14-day course -Urine culture with multiple organisms, but with no dysuria noted.  Maintain on current antibiotics  Hypokalemia-resolved -Monitor repeat a.m. labs  Heroin/marijuana/cocaine positivity -Counseled on cessation -We will need referral to substance abuse on discharge   DVT prophylaxis: SCD Code Status: Full Family Communication: None at bedside Disposition Plan: Per GI.  Consider for discharge soon if stable and no further plans noted.   Consultants:   GI  Procedures:   None  Antimicrobials:  Anti-infectives (From admission, onward)   Start     Dose/Rate Route Frequency Ordered Stop   02/23/19 0430  metroNIDAZOLE (FLAGYL) tablet 500 mg     500 mg Oral Every 12 hours 02/23/19 0422 03/02/19 0959   02/23/19 0400  cefoTEtan (CEFOTAN) 2 g in sodium chloride 0.9 % 100 mL IVPB     2 g 200 mL/hr over 30 Minutes Intravenous  Once 02/23/19 0347 02/23/19 0714   02/23/19 0400  doxycycline (VIBRA-TABS) tablet 100 mg     100 mg Oral Every 12 hours 02/23/19 0347         Subjective: Patient seen and evaluated today with no new acute complaints or  concerns. No acute concerns or events noted overnight.  She is complaining of some mild epigastric abdominal pain.  Objective: Vitals:   02/23/19 0614 02/23/19 2114 02/24/19 0323 02/24/19 1400  BP: (!) 102/54  115/60 110/69 112/65  Pulse: 71 (!) 52 (!) 58 (!) 54  Resp: 16 20 18 18   Temp: 99.3 F (37.4 C) 98.9 F (37.2 C) 98.1 F (36.7 C) 98.9 F (37.2 C)  TempSrc: Oral Oral Oral Oral  SpO2: 98% 100% 100% 100%  Weight: 81.2 kg     Height: 5\' 9"  (1.753 m)       Intake/Output Summary (Last 24 hours) at 02/24/2019 1445 Last data filed at 02/24/2019 1000 Gross per 24 hour  Intake 1491.11 ml  Output 400 ml  Net 1091.11 ml   Filed Weights   02/23/19 0614  Weight: 81.2 kg    Examination:  General exam: Appears calm and comfortable  Respiratory system: Clear to auscultation. Respiratory effort normal. Cardiovascular system: S1 & S2 heard, RRR. No JVD, murmurs, rubs, gallops or clicks. No pedal edema. Gastrointestinal system: Abdomen is nondistended, soft and nontender. No organomegaly or masses felt. Normal bowel sounds heard. Central nervous system: Alert and oriented. No focal neurological deficits. Extremities: Symmetric 5 x 5 power. Skin: No rashes, lesions or ulcers Psychiatry: Judgement and insight appear normal. Mood & affect appropriate.     Data Reviewed: I have personally reviewed following labs and imaging studies  CBC: Recent Labs  Lab 02/22/19 1704 02/23/19 0957 02/24/19 0310  WBC 4.9 4.7 5.8  HGB 12.7 11.3* 12.4  HCT 39.5 33.3* 35.8*  MCV 87.2 85.2 84.0  PLT 313 251 291   Basic Metabolic Panel: Recent Labs  Lab 02/22/19 1704 02/23/19 0957 02/24/19 0310  NA 135 137 139  K 3.0* 3.4* 3.6  CL 99 106 109  CO2 27 23 22   GLUCOSE 96 107* 91  BUN 5* <5* <5*  CREATININE 0.67 0.65 0.68  CALCIUM 8.7* 8.2* 8.5*   GFR: Estimated Creatinine Clearance: 117.2 mL/min (by C-G formula based on SCr of 0.68 mg/dL). Liver Function Tests: Recent Labs  Lab 02/22/19 1704 02/23/19 0957 02/24/19 0310  AST 730* 318* 186*  ALT 1,120* 730* 596*  ALKPHOS 156* 170* 190*  BILITOT 7.2* 5.4* 4.8*  PROT 6.9 5.8* 6.1*  ALBUMIN 3.2* 2.5* 2.4*   Recent Labs  Lab 02/22/19 1704    LIPASE 29   Recent Labs  Lab 02/23/19 1452  AMMONIA 71*   Coagulation Profile: Recent Labs  Lab 02/22/19 1939 02/23/19 0957 02/24/19 0310  INR 1.3* 1.3* 1.3*   Cardiac Enzymes: No results for input(s): CKTOTAL, CKMB, CKMBINDEX, TROPONINI in the last 168 hours. BNP (last 3 results) No results for input(s): PROBNP in the last 8760 hours. HbA1C: No results for input(s): HGBA1C in the last 72 hours. CBG: No results for input(s): GLUCAP in the last 168 hours. Lipid Profile: No results for input(s): CHOL, HDL, LDLCALC, TRIG, CHOLHDL, LDLDIRECT in the last 72 hours. Thyroid Function Tests: No results for input(s): TSH, T4TOTAL, FREET4, T3FREE, THYROIDAB in the last 72 hours. Anemia Panel: No results for input(s): VITAMINB12, FOLATE, FERRITIN, TIBC, IRON, RETICCTPCT in the last 72 hours. Sepsis Labs: No results for input(s): PROCALCITON, LATICACIDVEN in the last 168 hours.  Recent Results (from the past 240 hour(s))  Urine culture     Status: Abnormal   Collection Time: 02/22/19  4:36 PM   Specimen: Urine, Random  Result Value Ref Range Status   Specimen Description  Final    URINE, RANDOM Performed at [facility] [place], [address] [place], [place] [address]    Special Requests   Final    NONE Performed at [facility] [place] [facility], [address] [place], [place] [address]    Culture (A)  Final    10,000 COLONIES/mL MULTIPLE SPECIES PRESENT, SUGGEST RECOLLECTION   Report Status 02/24/2019 FINAL  Final  Wet prep, genital     Status: Abnormal   Collection Time: 02/23/19  3:40 AM   Specimen: Thin Prep Cervical/Endocervical  Result Value Ref Range Status   Yeast Wet Prep HPF POC NONE SEEN NONE SEEN Final   Trich, Wet Prep PRESENT (A) NONE SEEN Final   Clue Cells Wet Prep HPF POC PRESENT (A) NONE SEEN Final   WBC, Wet Prep HPF POC FEW (A) NONE SEEN Final   Sperm NONE SEEN  Final    Comment: Performed at [facility], [address] [place], [place]  [address]  SARS Coronavirus 2 [facility](Hospital order, Performed in [facility] hospital lab) Nasopharyngeal Nasopharyngeal Swab     Status: None   Collection Time: 02/23/19  5:34 AM   Specimen: Nasopharyngeal Swab  Result Value Ref Range Status   SARS Coronavirus 2 NEGATIVE NEGATIVE Final    Comment: (NOTE) If result is NEGATIVE SARS-CoV-2 target nucleic acids are NOT DETECTED. The SARS-CoV-2 RNA is generally detectable in upper and lower  respiratory specimens during the acute phase of infection. The lowest  concentration of SARS-CoV-2 viral copies this assay can detect is 250  copies / mL. A negative result does not preclude SARS-CoV-2 infection  and should not be used as the sole basis for treatment or other  patient management decisions.  A negative result may occur with  improper specimen collection / handling, submission of specimen other  than nasopharyngeal swab, presence of viral mutation(s) within the  areas targeted by this assay, and inadequate number of viral copies  (<250 copies / mL). A negative result must be combined with clinical  observations, patient history, and epidemiological information. If result is POSITIVE SARS-CoV-2 target nucleic acids are DETECTED. The SARS-CoV-2 RNA is generally detectable in upper and lower  respiratory specimens dur ing the acute phase of infection.  Positive  results are indicative of active infection with SARS-CoV-2.  Clinical  correlation with patient history and other diagnostic information is  necessary to determine patient infection status.  Positive results do  not rule out bacterial infection or co-infection with other viruses. If result is PRESUMPTIVE POSTIVE SARS-CoV-2 nucleic acids MAY BE PRESENT.   A presumptive positive result was obtained on the submitted specimen  and confirmed on repeat testing.  While 2019 novel coronavirus  (SARS-CoV-2) nucleic acids may be present in the submitted sample  additional confirmatory testing may  be necessary for epidemiological  and / or clinical management purposes  to differentiate between  SARS-CoV-2 and other Sarbecovirus currently known to infect humans.  If clinically indicated additional testing with an alternate test  methodology [id number]) is advised. The SARS-CoV-2 RNA is generally  detectable in upper and lower respiratory sp ecimens during the acute  phase of infection. The expected result is Negative. Fact Sheet for Patients:  BoilerBrush.com.cyhttps://www.fda.gov/media/136312/download Fact Sheet for Healthcare Providers: https://pope.com/https://www.fda.gov/media/136313/download This test is not yet approved or cleared by the [place] FDA and has been authorized for detection and/or diagnosis of SARS-CoV-2 by FDA under an Emergency Use Authorization (EUA).  This EUA will remain in  effect (meaning this test can be used) for the duration of the COVID-19 declaration under Section 564(b)(1) of the Act, 21 U.S.C. section 360bbb-3(b)(1), unless the authorization is terminated or revoked sooner. Performed at [facility], [address] [place], [place] [address]          Radiology Studies: Dg Chest 2 View  Result Date: 02/23/2019 CLINICAL DATA:  Patient with generalized weakness and abdominal pain. Urinary frequency. EXAM: CHEST - 2 VIEW COMPARISON:  None. FINDINGS: Normal cardiac and mediastinal contours. Hazy opacity over the left mid and lower lung favored to be secondary to overlying soft tissue. No large area pulmonary consolidation. No pleural effusion or pneumothorax. Regional skeleton is unremarkable. IMPRESSION: Hazy opacity left mid lung favored to be secondary to overlapping soft tissue. Consider PA chest radiograph. Otherwise no acute cardiopulmonary process. Electronically Signed   By: Annia Beltrew  Davis M.D.   On: 02/23/2019 05:55   [place] Transvaginal Non-ob  Result Date: 02/23/2019 CLINICAL DATA:  Left adnexal pain. EXAM: TRANSABDOMINAL AND TRANSVAGINAL ULTRASOUND OF PELVIS DOPPLER  ULTRASOUND OF OVARIES TECHNIQUE: Both transabdominal and transvaginal ultrasound examinations of the pelvis were performed. Transabdominal technique was performed for global imaging of the pelvis including uterus, ovaries, adnexal regions, and pelvic cul-de-sac. It was necessary to proceed with endovaginal exam following the transabdominal exam to visualize the adnexal structures. Color and duplex Doppler ultrasound was utilized to evaluate blood flow to the ovaries. COMPARISON:  None. FINDINGS: Uterus Measurements: 9.5 x 4.3 x 5.8 cm = volume: 120.7 mL. No fibroids or other mass visualized. Endometrium Thickness: 11 mm.  No focal abnormality visualized. Right ovary Measurements: 3.7 x 1.9 x 2.4 cm = volume: 8.4 mL. Normal appearance/no adnexal mass. Left ovary Measurements: 4.0 x 2.3 x 3.4 cm = volume: 16.6 mL. Normal appearance/no adnexal mass. Corpus luteum left ovary. Pulsed Doppler evaluation of both ovaries demonstrates normal low-resistance arterial and venous waveforms. Other findings Trace pelvic fluid. IMPRESSION: No acute process within the pelvis. No sonographic evidence to suggest torsion. Electronically Signed   By: Annia Beltrew  Davis M.D.   On: 02/23/2019 05:22   [place] Pelvis Complete  Result Date: 02/23/2019 CLINICAL DATA:  Left adnexal pain. EXAM: TRANSABDOMINAL AND TRANSVAGINAL ULTRASOUND OF PELVIS DOPPLER ULTRASOUND OF OVARIES TECHNIQUE: Both transabdominal and transvaginal ultrasound examinations of the pelvis were performed. Transabdominal technique was performed for global imaging of the pelvis including uterus, ovaries, adnexal regions, and pelvic cul-de-sac. It was necessary to proceed with endovaginal exam following the transabdominal exam to visualize the adnexal structures. Color and duplex Doppler ultrasound was utilized to evaluate blood flow to the ovaries. COMPARISON:  None. FINDINGS: Uterus Measurements: 9.5 x 4.3 x 5.8 cm = volume: 120.7 mL. No fibroids or other mass visualized. Endometrium  Thickness: 11 mm.  No focal abnormality visualized. Right ovary Measurements: 3.7 x 1.9 x 2.4 cm = volume: 8.4 mL. Normal appearance/no adnexal mass. Left ovary Measurements: 4.0 x 2.3 x 3.4 cm = volume: 16.6 mL. Normal appearance/no adnexal mass. Corpus luteum left ovary. Pulsed Doppler evaluation of both ovaries demonstrates normal low-resistance arterial and venous waveforms. Other findings Trace pelvic fluid. IMPRESSION: No acute process within the pelvis. No sonographic evidence to suggest torsion. Electronically Signed   By: Annia Beltrew  Davis M.D.   On: 02/23/2019 05:22   [place] Art/ven Flow Abd Pelv Doppler  Result Date: 02/23/2019 CLINICAL DATA:  Left adnexal pain. EXAM: TRANSABDOMINAL AND TRANSVAGINAL ULTRASOUND OF PELVIS DOPPLER ULTRASOUND OF OVARIES TECHNIQUE: Both transabdominal and transvaginal ultrasound examinations of the  pelvis were performed. Transabdominal technique was performed for global imaging of the pelvis including uterus, ovaries, adnexal regions, and pelvic cul-de-sac. It was necessary to proceed with endovaginal exam following the transabdominal exam to visualize the adnexal structures. Color and duplex Doppler ultrasound was utilized to evaluate blood flow to the ovaries. COMPARISON:  None. FINDINGS: Uterus Measurements: 9.5 x 4.3 x 5.8 cm = volume: 120.7 mL. No fibroids or other mass visualized. Endometrium Thickness: 11 mm.  No focal abnormality visualized. Right ovary Measurements: 3.7 x 1.9 x 2.4 cm = volume: 8.4 mL. Normal appearance/no adnexal mass. Left ovary Measurements: 4.0 x 2.3 x 3.4 cm = volume: 16.6 mL. Normal appearance/no adnexal mass. Corpus luteum left ovary. Pulsed Doppler evaluation of both ovaries demonstrates normal low-resistance arterial and venous waveforms. Other findings Trace pelvic fluid. IMPRESSION: No acute process within the pelvis. No sonographic evidence to suggest torsion. Electronically Signed   By: Annia Belt M.D.   On: 02/23/2019 05:22   US Abdomen  Limited Ruq  Result Date: 02/22/2019 CLINICAL DATA:  Epigastric pain EXAM: ULTRASOUND ABDOMEN LIMITED RIGHT UPPER QUADRANT COMPARISON:  None. FINDINGS: Gallbladder: thickened gallbladder wall to 7 mm. The gallbladder is nondistended. No gallstones are present. No pericholecystic fluid. Negative sonographic Murphy's sign. Common bile duct: Diameter: Normal at 4 mm Liver: Homogeneous liver echotexture. No duct dilatation. Portal vein is patent on color Doppler imaging with normal direction of blood flow towards the liver. Other: None. IMPRESSION: 1. Thickened gallbladder wall without evidence of cholecystitis. No gallstones present. No gallbladder distension. 2. Normal common bile duct and liver. Electronically Signed   By: Genevive Bi M.D.   On: 02/22/2019 18:38        Scheduled Meds:  doxycycline  100 mg Oral Q12H   metroNIDAZOLE  500 mg Oral Q12H   phytonadione  10 mg Oral Daily   Continuous Infusions:  dextrose 5 % and 0.9% NaCl 100 mL/hr at 02/23/19 0634     LOS: 1 day    Time spent: 30 minutes    Deleah Tison Hoover Brunette, DO [facility] Pager [phone]  If 7PM-7AM, please contact night-coverage [email or web address] Password [email or web address] 02/24/2019, 2:45 PM

## 2019-02-24 NOTE — Progress Notes (Signed)
CSW met with the patient at bedside. The patient was asleep. CSW offered to speak with the patient and provide substance abuse resources. CSW left the resources at bedside. CSW thanked the patient for her time.   CSW will continue to follow as needed.   Domenic Schwab, MSW, [place] Worker [facility]  [phone]

## 2019-02-24 NOTE — Progress Notes (Signed)
Daily Rounding Note  02/24/2019, 12:11 PM  LOS: 1 day   SUBJECTIVE:   Chief complaint: Acute hepatitis. Still having pain across her upper abdomen.  This is not better or worse when she eats.  She is tolerating solid food.  Last bowel movement was yesterday.  No nausea.  Feels like she could go home and is wondering when we think she might discharge.  OBJECTIVE:         Vital signs in last 24 hours:    Temp:  [98.1 F (36.7 C)-98.9 F (37.2 C)] 98.1 F (36.7 C) (08/03 0323) Pulse Rate:  [52-58] 58 (08/03 0323) Resp:  [18-20] 18 (08/03 0323) BP: (110-115)/(60-69) 110/69 (08/03 0323) SpO2:  [100 %] 100 % (08/03 0323) Last BM Date: 02/23/19 Filed Weights   02/23/19 0614  Weight: 81.2 kg   General: Laying comfortably in bed.  Looks slightly ill but certainly not toxic. Heart: RRR. Chest: Clear bilaterally.  No labored breathing. Abdomen: Soft.  Tender across the upper abdomen without guarding or rebound.  Bowel sounds active.  No HSM, masses, hernias, bruits. Extremities: No CCE. Derm: Multiple tattoos. Neuro/Psych: Alert.  Oriented x3.  No tremors, no asterixis.  Grossly normal strength.  Intake/Output from previous day: 08/02 0701 - 08/03 0700 In: 1251.1 [P.O.:240; I.V.:961.1; IV Piggyback:50] Out: 400 [Urine:400]  Intake/Output this shift: No intake/output data recorded.  Lab Results: Recent Labs    02/22/19 1704 02/23/19 0957 02/24/19 0310  WBC 4.9 4.7 5.8  HGB 12.7 11.3* 12.4  HCT 39.5 33.3* 35.8*  PLT 313 251 291   BMET Recent Labs    02/22/19 1704 02/23/19 0957 02/24/19 0310  NA 135 137 139  K 3.0* 3.4* 3.6  CL 99 106 109  CO2 27 23 22   GLUCOSE 96 107* 91  BUN 5* <5* <5*  CREATININE 0.67 0.65 0.68  CALCIUM 8.7* 8.2* 8.5*   LFT Recent Labs    02/22/19 1704 02/23/19 0957 02/24/19 0310  PROT 6.9 5.8* 6.1*  ALBUMIN 3.2* 2.5* 2.4*  AST 730* 318* 186*  ALT 1,120* 730* 596*  ALKPHOS  156* 170* 190*  BILITOT 7.2* 5.4* 4.8*   PT/INR Recent Labs    02/23/19 0957 02/24/19 0310  LABPROT 15.9* 15.6*  INR 1.3* 1.3*   Hepatitis Panel No results for input(s): HEPBSAG, HCVAB, HEPAIGM, HEPBIGM in the last 72 hours.  Studies/Results: Dg Chest 2 View  Result Date: 02/23/2019 CLINICAL DATA:  Patient with generalized weakness and abdominal pain. Urinary frequency. EXAM: CHEST - 2 VIEW COMPARISON:  None. FINDINGS: Normal cardiac and mediastinal contours. Hazy opacity over the left mid and lower lung favored to be secondary to overlying soft tissue. No large area pulmonary consolidation. No pleural effusion or pneumothorax. Regional skeleton is unremarkable. IMPRESSION: Hazy opacity left mid lung favored to be secondary to overlapping soft tissue. Consider PA chest radiograph. Otherwise no acute cardiopulmonary process. Electronically Signed   By: Lovey Newcomer M.D.   On: 02/23/2019 05:55   US Transvaginal Non-ob  Result Date: 02/23/2019 CLINICAL DATA:  Left adnexal pain. EXAM: TRANSABDOMINAL AND TRANSVAGINAL ULTRASOUND OF PELVIS DOPPLER ULTRASOUND OF OVARIES TECHNIQUE: Both transabdominal and transvaginal ultrasound examinations of the pelvis were performed. Transabdominal technique was performed for global imaging of the pelvis including uterus, ovaries, adnexal regions, and pelvic cul-de-sac. It was necessary to proceed with endovaginal exam following the transabdominal exam to visualize the adnexal structures. Color and duplex Doppler ultrasound was utilized to evaluate blood  flow to the ovaries. COMPARISON:  None. FINDINGS: Uterus Measurements: 9.5 x 4.3 x 5.8 cm = volume: 120.7 mL. No fibroids or other mass visualized. Endometrium Thickness: 11 mm.  No focal abnormality visualized. Right ovary Measurements: 3.7 x 1.9 x 2.4 cm = volume: 8.4 mL. Normal appearance/no adnexal mass. Left ovary Measurements: 4.0 x 2.3 x 3.4 cm = volume: 16.6 mL. Normal appearance/no adnexal mass. Corpus luteum  left ovary. Pulsed Doppler evaluation of both ovaries demonstrates normal low-resistance arterial and venous waveforms. Other findings Trace pelvic fluid. IMPRESSION: No acute process within the pelvis. No sonographic evidence to suggest torsion. Electronically Signed   By: Annia Beltrew  Davis M.D.   On: 02/23/2019 05:22   [place] Pelvis Complete  Result Date: 02/23/2019 CLINICAL DATA:  Left adnexal pain. EXAM: TRANSABDOMINAL AND TRANSVAGINAL ULTRASOUND OF PELVIS DOPPLER ULTRASOUND OF OVARIES TECHNIQUE: Both transabdominal and transvaginal ultrasound examinations of the pelvis were performed. Transabdominal technique was performed for global imaging of the pelvis including uterus, ovaries, adnexal regions, and pelvic cul-de-sac. It was necessary to proceed with endovaginal exam following the transabdominal exam to visualize the adnexal structures. Color and duplex Doppler ultrasound was utilized to evaluate blood flow to the ovaries. COMPARISON:  None. FINDINGS: Uterus Measurements: 9.5 x 4.3 x 5.8 cm = volume: 120.7 mL. No fibroids or other mass visualized. Endometrium Thickness: 11 mm.  No focal abnormality visualized. Right ovary Measurements: 3.7 x 1.9 x 2.4 cm = volume: 8.4 mL. Normal appearance/no adnexal mass. Left ovary Measurements: 4.0 x 2.3 x 3.4 cm = volume: 16.6 mL. Normal appearance/no adnexal mass. Corpus luteum left ovary. Pulsed Doppler evaluation of both ovaries demonstrates normal low-resistance arterial and venous waveforms. Other findings Trace pelvic fluid. IMPRESSION: No acute process within the pelvis. No sonographic evidence to suggest torsion. Electronically Signed   By: Annia Beltrew  Davis M.D.   On: 02/23/2019 05:22   [place] Art/ven Flow Abd Pelv Doppler  Result Date: 02/23/2019 CLINICAL DATA:  Left adnexal pain. EXAM: TRANSABDOMINAL AND TRANSVAGINAL ULTRASOUND OF PELVIS DOPPLER ULTRASOUND OF OVARIES TECHNIQUE: Both transabdominal and transvaginal ultrasound examinations of the pelvis were performed.  Transabdominal technique was performed for global imaging of the pelvis including uterus, ovaries, adnexal regions, and pelvic cul-de-sac. It was necessary to proceed with endovaginal exam following the transabdominal exam to visualize the adnexal structures. Color and duplex Doppler ultrasound was utilized to evaluate blood flow to the ovaries. COMPARISON:  None. FINDINGS: Uterus Measurements: 9.5 x 4.3 x 5.8 cm = volume: 120.7 mL. No fibroids or other mass visualized. Endometrium Thickness: 11 mm.  No focal abnormality visualized. Right ovary Measurements: 3.7 x 1.9 x 2.4 cm = volume: 8.4 mL. Normal appearance/no adnexal mass. Left ovary Measurements: 4.0 x 2.3 x 3.4 cm = volume: 16.6 mL. Normal appearance/no adnexal mass. Corpus luteum left ovary. Pulsed Doppler evaluation of both ovaries demonstrates normal low-resistance arterial and venous waveforms. Other findings Trace pelvic fluid. IMPRESSION: No acute process within the pelvis. No sonographic evidence to suggest torsion. Electronically Signed   By: Annia Beltrew  Davis M.D.   On: 02/23/2019 05:22   [place] Abdomen Limited Ruq  Result Date: 02/22/2019 CLINICAL DATA:  Epigastric pain EXAM: ULTRASOUND ABDOMEN LIMITED RIGHT UPPER QUADRANT COMPARISON:  None. FINDINGS: Gallbladder: thickened gallbladder wall to 7 mm. The gallbladder is nondistended. No gallstones are present. No pericholecystic fluid. Negative sonographic Murphy's sign. Common bile duct: Diameter: Normal at 4 mm Liver: Homogeneous liver echotexture. No duct dilatation. Portal vein is patent on color Doppler imaging with normal direction of  blood flow towards the liver. Other: None. IMPRESSION: 1. Thickened gallbladder wall without evidence of cholecystitis. No gallstones present. No gallbladder distension. 2. Normal common bile duct and liver. Electronically Signed   By: Genevive BiStewart  Edmunds M.D.   On: 02/22/2019 18:38   Scheduled Meds:  doxycycline  100 mg Oral Q12H   metroNIDAZOLE  500 mg Oral Q12H    phytonadione  10 mg Oral Daily   Continuous Infusions:  acetylcysteine 15 mg/kg/hr (02/24/19 0222)   dextrose 5 % and 0.9% NaCl 100 mL/hr at 02/23/19 0634   PRN Meds:.ketorolac, ondansetron **OR** ondansetron (ZOFRAN) IV   ASSESMENT:   *   Upper abdominal pain, N/V/D, elevated LFTs.  Lipase normal. Gallbladder wall thickening but no cholecystitis or stones on ultrasound.  Though Tylenol level not elevated, empiric acetylcysteine in place. LFTs all steadily improving. Pending labs include mitochondrial antibody, smooth muscle antibody, ANA.  Hepatitis serologies pending The degree to which her transaminases are elevated suggests acute hepatitis, possibly ischemic hepatopathy.  *    Stable mild coagulopathy.  INR 1.3.  *    Polysubstance abuse IVDA, intranasal, oral.  *     STD with BV and trichomoniasis.  On doxycycline and metronidazole.   PLAN   *    Supportive care.  Await results from multiple lab studies that are pending. Defer discharge home timing to physicians.    Jennye MoccasinSarah Meghen Akopyan  02/24/2019, 12:11 PM Phone [phone]

## 2019-02-25 ENCOUNTER — Telehealth: Payer: Self-pay

## 2019-02-25 LAB — IMMUNOGLOBULINS A/E/G/M, SERUM
IgA: 261 mg/dL (ref 87–352)
IgE (Immunoglobulin E), Serum: 137 IU/mL (ref 6–495)
IgG (Immunoglobin G), Serum: 1245 mg/dL (ref 586–1602)
IgM (Immunoglobulin M), Srm: 290 mg/dL — ABNORMAL HIGH (ref 26–217)

## 2019-02-25 LAB — GC/CHLAMYDIA PROBE AMP (~~LOC~~) NOT AT ARMC
Chlamydia: NEGATIVE
Neisseria Gonorrhea: NEGATIVE

## 2019-02-25 MED ORDER — METRONIDAZOLE 500 MG PO TABS: 500.0000 mg | ORAL_TABLET | Freq: Two times a day (BID) | ORAL | 0 refills | Status: AC

## 2019-02-25 MED ORDER — DOXYCYCLINE HYCLATE 100 MG PO TABS: 100.0000 mg | ORAL_TABLET | Freq: Two times a day (BID) | ORAL | 0 refills | Status: AC

## 2019-02-25 NOTE — Plan of Care (Signed)

## 2019-02-25 NOTE — Discharge Summary (Signed)
Physician Discharge Summary  Victoria Valenzuela WUJ:[id number] DOB: 1986/06/27 DOA: 02/22/2019  PCP: Patient, No Pcp Per  Admit date: 02/22/2019  Discharge date: 02/25/2019  Admitted From:Home  Disposition:  Home  Recommendations for Outpatient Follow-up:  1. Follow up with PCP in 1-2 weeks; to be arranged by Care management 2. Follow-up with GI as recommended in the outpatient setting and this will be set up for her 3. Avoid further illicit drug use-patient has been counseled on this 4. Continue on Flagyl and doxycycline as prescribed for 10 more days to complete course of treatment for bacterial vaginosis and trichomoniasis along with PID 5. Substance abuse resources will be provided prior to discharge  Home Health: None  Equipment/Devices: None  Discharge Condition: Stable  CODE STATUS: Full  Diet recommendation: Regular  Brief/Interim Summary: Per HPI: Elantra Caprara a 33 y.o.femalewith medical history significant ofIV drug abuse mainly heroin, cannabis use, high risk sexual encounters who presented to med [facility] initially with complaint of upper abdominal pain nausea vomiting and diarrhea for about a week. Patient noted gradual yellow discoloration of her eyes. Also malaise. She also noted increased urinary frequency and vaginal discharge which was mild address. She had dyspareunia. In the last 1 week she has had fever on and off with some chills. She has been sexually active with at least 2 partners. She has also used heroin for a number of years. Her last use was about a week ago. She is now using intranasal instead of injected. Patient is social alcohol drinker but takes Tylenol PM every night. No exposure to anybody was COVID-19. She was seen at the [facility] initially and found to have markedly elevated liver transaminases. Work-up with ultrasound of the liver showed no evidence of obstructive jaundice with gallbladder thickening but no evidence of  cholecystitis. Acute hepatitis secondary to liver injury or viral hepatitis was suspected. GI was consulted but patient left AGAINST MEDICAL ADVICE. She is came to [facility] now with the same complaints. Her acute otitis panel still pending. Patient had pelvic exam done showing significant adnexal tenderness with malodorous discharge. She is positive for bacterial vaginosis and trichomoniasis. Patient has been initiated on IV Cefotan and doxycycline. Also Flagyl in the ER and she has been admitted for further work-up.  Her LFTs have down trended nicely and she was noted to be positive for acute hepatitis A and also hepatitis C, but is yet to be determined if this is chronic.  She has been seen by GI who had a discussion with her stating that this is a self-limited infection and that her labs are very reassuring with full recovery expected.  Her viral load for hepatitis C is still pending and she may develop a chronic infection.  She has had her an acetylcysteine discontinued on 8/3 and has been counseled on cessation of IV drug use as well as intranasal cocaine use.  She will have follow-up arranged with GI in the outpatient setting and will have information provided regarding substance abuse counseling resources prior to discharge.  Will also have care management arrange PCP follow-up prior to discharge.  Additionally, she will remain on doxycycline and Flagyl for 10 more days to complete a 14-day course of treatment for her bacterial vaginosis, trichomoniasis and PID.  This is been prescribed for her and her symptoms have clinically improved.  No other acute events noted during the course of this admission.  She is otherwise stable for discharge.  Discharge Diagnoses:  Principal  Problem:   Acute hepatitis Active Problems:   Hypokalemia   PID (acute pelvic inflammatory disease)   Heroin abuse ([place])   Acute hepatitis A   Hepatitis C virus infection without hepatic coma   IV drug abuse  (A[place])  Principal discharge diagnosis: Acute hepatitis A along with acute versus chronic hepatitis C.  Discharge Instructions  Discharge Instructions    Diet - low sodium heart healthy   Complete by: As directed    Increase activity slowly   Complete by: As directed      Allergies as of 02/25/2019   No Known Allergies     Medication List    STOP taking these medications   diphenhydramine-acetaminophen 25-500 MG Tabs tablet Commonly known as: TYLENOL PM   ibuprofen 600 MG tablet Commonly known as: ADVIL     TAKE these medications   doxycycline 100 MG tablet Commonly known as: VIBRA-TABS Take 1 tablet (100 mg total) by mouth every 12 (twelve) hours for 10 days.   metroNIDAZOLE 500 MG tablet Commonly known as: FLAGYL Take 1 tablet (500 mg total) by mouth every 12 (twelve) hours for 10 days.      Follow-up Information    pcp Follow up in 1 week(s).        Pyrtle, Lajuan Lines, MD Follow up in 2 week(s).   Specialty: Gastroenterology Contact information: [address] [address] [phone]          No Known Allergies  Consultations:  GI   Procedures/Studies: Dg Chest 2 View  Result Date: 02/23/2019 CLINICAL DATA:  Patient with generalized weakness and abdominal pain. Urinary frequency. EXAM: CHEST - 2 VIEW COMPARISON:  None. FINDINGS: Normal cardiac and mediastinal contours. Hazy opacity over the left mid and lower lung favored to be secondary to overlying soft tissue. No large area pulmonary consolidation. No pleural effusion or pneumothorax. Regional skeleton is unremarkable. IMPRESSION: Hazy opacity left mid lung favored to be secondary to overlapping soft tissue. Consider PA chest radiograph. Otherwise no acute cardiopulmonary process. Electronically Signed   By: Lovey Newcomer M.D.   On: 02/23/2019 05:55   US Transvaginal Non-ob  Result Date: 02/23/2019 CLINICAL DATA:  Left adnexal pain. EXAM: TRANSABDOMINAL AND TRANSVAGINAL ULTRASOUND OF PELVIS  DOPPLER ULTRASOUND OF OVARIES TECHNIQUE: Both transabdominal and transvaginal ultrasound examinations of the pelvis were performed. Transabdominal technique was performed for global imaging of the pelvis including uterus, ovaries, adnexal regions, and pelvic cul-de-sac. It was necessary to proceed with endovaginal exam following the transabdominal exam to visualize the adnexal structures. Color and duplex Doppler ultrasound was utilized to evaluate blood flow to the ovaries. COMPARISON:  None. FINDINGS: Uterus Measurements: 9.5 x 4.3 x 5.8 cm = volume: 120.7 mL. No fibroids or other mass visualized. Endometrium Thickness: 11 mm.  No focal abnormality visualized. Right ovary Measurements: 3.7 x 1.9 x 2.4 cm = volume: 8.4 mL. Normal appearance/no adnexal mass. Left ovary Measurements: 4.0 x 2.3 x 3.4 cm = volume: 16.6 mL. Normal appearance/no adnexal mass. Corpus luteum left ovary. Pulsed Doppler evaluation of both ovaries demonstrates normal low-resistance arterial and venous waveforms. Other findings Trace pelvic fluid. IMPRESSION: No acute process within the pelvis. No sonographic evidence to suggest torsion. Electronically Signed   By: Lovey Newcomer M.D.   On: 02/23/2019 05:22   US Pelvis Complete  Result Date: 02/23/2019 CLINICAL DATA:  Left adnexal pain. EXAM: TRANSABDOMINAL AND TRANSVAGINAL ULTRASOUND OF PELVIS DOPPLER ULTRASOUND OF OVARIES TECHNIQUE: Both transabdominal and transvaginal ultrasound examinations of the  pelvis were performed. Transabdominal technique was performed for global imaging of the pelvis including uterus, ovaries, adnexal regions, and pelvic cul-de-sac. It was necessary to proceed with endovaginal exam following the transabdominal exam to visualize the adnexal structures. Color and duplex Doppler ultrasound was utilized to evaluate blood flow to the ovaries. COMPARISON:  None. FINDINGS: Uterus Measurements: 9.5 x 4.3 x 5.8 cm = volume: 120.7 mL. No fibroids or other mass visualized.  Endometrium Thickness: 11 mm.  No focal abnormality visualized. Right ovary Measurements: 3.7 x 1.9 x 2.4 cm = volume: 8.4 mL. Normal appearance/no adnexal mass. Left ovary Measurements: 4.0 x 2.3 x 3.4 cm = volume: 16.6 mL. Normal appearance/no adnexal mass. Corpus luteum left ovary. Pulsed Doppler evaluation of both ovaries demonstrates normal low-resistance arterial and venous waveforms. Other findings Trace pelvic fluid. IMPRESSION: No acute process within the pelvis. No sonographic evidence to suggest torsion. Electronically Signed   By: Annia Beltrew  Davis M.D.   On: 02/23/2019 05:22   [place] Art/ven Flow Abd Pelv Doppler  Result Date: 02/23/2019 CLINICAL DATA:  Left adnexal pain. EXAM: TRANSABDOMINAL AND TRANSVAGINAL ULTRASOUND OF PELVIS DOPPLER ULTRASOUND OF OVARIES TECHNIQUE: Both transabdominal and transvaginal ultrasound examinations of the pelvis were performed. Transabdominal technique was performed for global imaging of the pelvis including uterus, ovaries, adnexal regions, and pelvic cul-de-sac. It was necessary to proceed with endovaginal exam following the transabdominal exam to visualize the adnexal structures. Color and duplex Doppler ultrasound was utilized to evaluate blood flow to the ovaries. COMPARISON:  None. FINDINGS: Uterus Measurements: 9.5 x 4.3 x 5.8 cm = volume: 120.7 mL. No fibroids or other mass visualized. Endometrium Thickness: 11 mm.  No focal abnormality visualized. Right ovary Measurements: 3.7 x 1.9 x 2.4 cm = volume: 8.4 mL. Normal appearance/no adnexal mass. Left ovary Measurements: 4.0 x 2.3 x 3.4 cm = volume: 16.6 mL. Normal appearance/no adnexal mass. Corpus luteum left ovary. Pulsed Doppler evaluation of both ovaries demonstrates normal low-resistance arterial and venous waveforms. Other findings Trace pelvic fluid. IMPRESSION: No acute process within the pelvis. No sonographic evidence to suggest torsion. Electronically Signed   By: Annia Beltrew  Davis M.D.   On: 02/23/2019 05:22   [place]  Abdomen Limited Ruq  Result Date: 02/22/2019 CLINICAL DATA:  Epigastric pain EXAM: ULTRASOUND ABDOMEN LIMITED RIGHT UPPER QUADRANT COMPARISON:  None. FINDINGS: Gallbladder: thickened gallbladder wall to 7 mm. The gallbladder is nondistended. No gallstones are present. No pericholecystic fluid. Negative sonographic Murphy's sign. Common bile duct: Diameter: Normal at 4 mm Liver: Homogeneous liver echotexture. No duct dilatation. Portal vein is patent on color Doppler imaging with normal direction of blood flow towards the liver. Other: None. IMPRESSION: 1. Thickened gallbladder wall without evidence of cholecystitis. No gallstones present. No gallbladder distension. 2. Normal common bile duct and liver. Electronically Signed   By: Genevive BiStewart  Edmunds M.D.   On: 02/22/2019 18:38      Subjective:   Discharge Exam: Vitals:   02/24/19 2035 02/25/19 0617  BP: (!) 101/54 100/66  Pulse: (!) 49 (!) 55  Resp: 16 18  Temp: 97.9 F (36.6 C) 98 F (36.7 C)  SpO2: 100% 100%   Vitals:   02/24/19 0323 02/24/19 1400 02/24/19 2035 02/25/19 0617  BP: 110/69 112/65 (!) 101/54 100/66  Pulse: (!) 58 (!) 54 (!) 49 (!) 55  Resp: 18 18 16 18   Temp: 98.1 F (36.7 C) 98.9 F (37.2 C) 97.9 F (36.6 C) 98 F (36.7 C)  TempSrc: Oral Oral  Oral  SpO2: 100% 100%  100% 100%  Weight:      Height:        General: Pt is alert, awake, not in acute distress Cardiovascular: RRR, S1/S2 +, no rubs, no gallops Respiratory: CTA bilaterally, no wheezing, no rhonchi Abdominal: Soft, NT, ND, bowel sounds + Extremities: no edema, no cyanosis    The results of significant diagnostics from this hospitalization (including imaging, microbiology, ancillary and laboratory) are listed below for reference.     Microbiology: Recent Results (from the past 240 hour(s))  Urine culture     Status: Abnormal   Collection Time: 02/22/19  4:36 PM   Specimen: Urine, Random  Result Value Ref Range Status   Specimen Description    Final    URINE, RANDOM Performed at [facility], [address] [place], [place] [address]    Special Requests   Final    NONE Performed at [facility], [address] [place], [place] [address]    Culture (A)  Final    10,000 COLONIES/mL MULTIPLE SPECIES PRESENT, SUGGEST RECOLLECTION   Report Status 02/24/2019 FINAL  Final  Wet prep, genital     Status: Abnormal   Collection Time: 02/23/19  3:40 AM   Specimen: Thin Prep Cervical/Endocervical  Result Value Ref Range Status   Yeast Wet Prep HPF POC NONE SEEN NONE SEEN Final   Trich, Wet Prep PRESENT (A) NONE SEEN Final   Clue Cells Wet Prep HPF POC PRESENT (A) NONE SEEN Final   WBC, Wet Prep HPF POC FEW (A) NONE SEEN Final   Sperm NONE SEEN  Final    Comment: Performed at [facility], [address], [place], [place] [address]  SARS Coronavirus 2 [facility] order, Performed in [facility] hospital lab) Nasopharyngeal Nasopharyngeal Swab     Status: None   Collection Time: 02/23/19  5:34 AM   Specimen: Nasopharyngeal Swab  Result Value Ref Range Status   SARS Coronavirus 2 NEGATIVE NEGATIVE Final    Comment: (NOTE) If result is NEGATIVE SARS-CoV-2 target nucleic acids are NOT DETECTED. The SARS-CoV-2 RNA is generally detectable in upper and lower  respiratory specimens during the acute phase of infection. The lowest  concentration of SARS-CoV-2 viral copies this assay can detect is 250  copies / mL. A negative result does not preclude SARS-CoV-2 infection  and should not be used as the sole basis for treatment or other  patient management decisions.  A negative result may occur with  improper specimen collection / handling, submission of specimen other  than nasopharyngeal swab, presence of viral mutation(s) within the  areas targeted by this assay, and inadequate number of viral copies  (<250 copies / mL). A negative result must be combined with clinical  observations, patient history, and  epidemiological information. If result is POSITIVE SARS-CoV-2 target nucleic acids are DETECTED. The SARS-CoV-2 RNA is generally detectable in upper and lower  respiratory specimens dur ing the acute phase of infection.  Positive  results are indicative of active infection with SARS-CoV-2.  Clinical  correlation with patient history and other diagnostic information is  necessary to determine patient infection status.  Positive results do  not rule out bacterial infection or co-infection with other viruses. If result is PRESUMPTIVE POSTIVE SARS-CoV-2 nucleic acids MAY BE PRESENT.   A presumptive positive result was obtained on the submitted specimen  and confirmed on repeat testing.  While 2019 novel coronavirus  (SARS-CoV-2) nucleic acids may be present in the submitted  sample  additional confirmatory testing may be necessary for epidemiological  and / or clinical management purposes  to differentiate between  SARS-CoV-2 and other Sarbecovirus currently known to infect humans.  If clinically indicated additional testing with an alternate test  methodology [id number]) is advised. The SARS-CoV-2 RNA is generally  detectable in upper and lower respiratory sp ecimens during the acute  phase of infection. The expected result is Negative. Fact Sheet for Patients:  BoilerBrush.com.cyhttps://www.fda.gov/media/136312/download Fact Sheet for Healthcare Providers: https://pope.com/https://www.fda.gov/media/136313/download This test is not yet approved or cleared by the [place] FDA and has been authorized for detection and/or diagnosis of SARS-CoV-2 by FDA under an Emergency Use Authorization (EUA).  This EUA will remain in effect (meaning this test can be used) for the duration of the COVID-19 declaration under Section 564(b)(1) of the Act, 21 U.S.C. section 360bbb-3(b)(1), unless the authorization is terminated or revoked sooner. Performed at [facility], [address] [place], [place] [address]       Labs: BNP (last 3 results) No results for input(s): BNP in the last 8760 hours. Basic Metabolic Panel: Recent Labs  Lab 02/22/19 1704 02/23/19 0957 02/24/19 0310  NA 135 137 139  K 3.0* 3.4* 3.6  CL 99 106 109  CO2 27 23 22   GLUCOSE 96 107* 91  BUN 5* <5* <5*  CREATININE 0.67 0.65 0.68  CALCIUM 8.7* 8.2* 8.5*   Liver Function Tests: Recent Labs  Lab 02/22/19 1704 02/23/19 0957 02/24/19 0310  AST 730* 318* 186*  ALT 1,120* 730* 596*  ALKPHOS 156* 170* 190*  BILITOT 7.2* 5.4* 4.8*  PROT 6.9 5.8* 6.1*  ALBUMIN 3.2* 2.5* 2.4*   Recent Labs  Lab 02/22/19 1704  LIPASE 29   Recent Labs  Lab 02/23/19 1452  AMMONIA 71*   CBC: Recent Labs  Lab 02/22/19 1704 02/23/19 0957 02/24/19 0310  WBC 4.9 4.7 5.8  HGB 12.7 11.3* 12.4  HCT 39.5 33.3* 35.8*  MCV 87.2 85.2 84.0  PLT 313 251 291   Cardiac Enzymes: No results for input(s): CKTOTAL, CKMB, CKMBINDEX, TROPONINI in the last 168 hours. BNP: Invalid input(s): POCBNP CBG: No results for input(s): GLUCAP in the last 168 hours. D-Dimer No results for input(s): DDIMER in the last 72 hours. Hgb A1c No results for input(s): HGBA1C in the last 72 hours. Lipid Profile No results for input(s): CHOL, HDL, LDLCALC, TRIG, CHOLHDL, LDLDIRECT in the last 72 hours. Thyroid function studies No results for input(s): TSH, T4TOTAL, T3FREE, THYROIDAB in the last 72 hours.  Invalid input(s): FREET3 Anemia work up No results for input(s): VITAMINB12, FOLATE, FERRITIN, TIBC, IRON, RETICCTPCT in the last 72 hours. Urinalysis    Component Value Date/Time   COLORURINE BROWN (A) 02/22/2019 1636   APPEARANCEUR CLOUDY (A) 02/22/2019 1636   LABSPEC >1.030 (H) 02/22/2019 1636   PHURINE 6.5 02/22/2019 1636   GLUCOSEU 100 (A) 02/22/2019 1636   HGBUR TRACE (A) 02/22/2019 1636   BILIRUBINUR LARGE (A) 02/22/2019 1636   KETONESUR 15 (A) 02/22/2019 1636   PROTEINUR 30 (A) 02/22/2019 1636   NITRITE POSITIVE (A) 02/22/2019 1636    LEUKOCYTESUR TRACE (A) 02/22/2019 1636   Sepsis Labs Invalid input(s): PROCALCITONIN,  WBC,  LACTICIDVEN Microbiology Recent Results (from the past 240 hour(s))  Urine culture     Status: Abnormal   Collection Time: 02/22/19  4:36 PM   Specimen: Urine, Random  Result Value Ref Range Status   Specimen Description   Final    URINE, RANDOM Performed at  [facility], [address] [place], [place] [address]    Special Requests   Final    NONE Performed at [facility] [place], [address] [place], [place] [address]    Culture (A)  Final    10,000 COLONIES/mL MULTIPLE SPECIES PRESENT, SUGGEST RECOLLECTION   Report Status 02/24/2019 FINAL  Final  Wet prep, genital     Status: Abnormal   Collection Time: 02/23/19  3:40 AM   Specimen: Thin Prep Cervical/Endocervical  Result Value Ref Range Status   Yeast Wet Prep HPF POC NONE SEEN NONE SEEN Final   Trich, Wet Prep PRESENT (A) NONE SEEN Final   Clue Cells Wet Prep HPF POC PRESENT (A) NONE SEEN Final   WBC, Wet Prep HPF POC FEW (A) NONE SEEN Final   Sperm NONE SEEN  Final    Comment: Performed at [facility], [address] [place], [place] [address]  SARS Coronavirus 2 [facility] order, Performed in [facility] hospital lab) Nasopharyngeal Nasopharyngeal Swab     Status: None   Collection Time: 02/23/19  5:34 AM   Specimen: Nasopharyngeal Swab  Result Value Ref Range Status   SARS Coronavirus 2 NEGATIVE NEGATIVE Final    Comment: (NOTE) If result is NEGATIVE SARS-CoV-2 target nucleic acids are NOT DETECTED. The SARS-CoV-2 RNA is generally detectable in upper and lower  respiratory specimens during the acute phase of infection. The lowest  concentration of SARS-CoV-2 viral copies this assay can detect is 250  copies / mL. A negative result does not preclude SARS-CoV-2 infection  and should not be used as the sole basis for treatment or other  patient management decisions.  A negative result may occur with   improper specimen collection / handling, submission of specimen other  than nasopharyngeal swab, presence of viral mutation(s) within the  areas targeted by this assay, and inadequate number of viral copies  (<250 copies / mL). A negative result must be combined with clinical  observations, patient history, and epidemiological information. If result is POSITIVE SARS-CoV-2 target nucleic acids are DETECTED. The SARS-CoV-2 RNA is generally detectable in upper and lower  respiratory specimens dur ing the acute phase of infection.  Positive  results are indicative of active infection with SARS-CoV-2.  Clinical  correlation with patient history and other diagnostic information is  necessary to determine patient infection status.  Positive results do  not rule out bacterial infection or co-infection with other viruses. If result is PRESUMPTIVE POSTIVE SARS-CoV-2 nucleic acids MAY BE PRESENT.   A presumptive positive result was obtained on the submitted specimen  and confirmed on repeat testing.  While 2019 novel coronavirus  (SARS-CoV-2) nucleic acids may be present in the submitted sample  additional confirmatory testing may be necessary for epidemiological  and / or clinical management purposes  to differentiate between  SARS-CoV-2 and other Sarbecovirus currently known to infect humans.  If clinically indicated additional testing with an alternate test  methodology [phone]([id number]) is advised. The SARS-CoV-2 RNA is generally  detectable in upper and lower respiratory sp ecimens during the acute  phase of infection. The expected result is Negative. Fact Sheet for Patients:  BoilerBrush.com.cyhttps://www.fda.gov/media/136312/download Fact Sheet for Healthcare Providers: https://pope.com/https://www.fda.gov/media/136313/download This test is not yet approved or cleared by the [place] FDA and has been authorized for detection and/or diagnosis of SARS-CoV-2 by FDA under an Emergency Use Authorization (EUA).  This EUA will  remain in effect (meaning this test can be used) for  the duration of the COVID-19 declaration under Section 564(b)(1) of the Act, 21 U.S.C. section 360bbb-3(b)(1), unless the authorization is terminated or revoked sooner. Performed at [facility], [address], [place], [place] [address]      Time coordinating discharge: 35 minutes  SIGNED:   Erick Blinks, DO [facility] 02/25/2019, 8:33 AM  If 7PM-7AM, please contact night-coverage [email or web address] Password [email or web address]

## 2019-02-25 NOTE — Telephone Encounter (Signed)
-----   Message from Jerene Bears, MD sent at 02/24/2019  6:34 PM EDT ----- Patient needs office followup --Acute hepatitis A, which is expected to resolve. --Hep C, unclear yet if active or now.  RNA pending.  If + we can refer to ID --LFTs improving Thanks

## 2019-02-25 NOTE — Telephone Encounter (Signed)
Appt made with Dr Rush Landmark on 03/25/19 at 1:30 pm. pt has been advised.

## 2019-02-26 ENCOUNTER — Telehealth: Payer: Self-pay

## 2019-02-26 DIAGNOSIS — B179 Acute viral hepatitis, unspecified: Secondary | ICD-10-CM

## 2019-02-26 NOTE — Telephone Encounter (Signed)
The pt has been advised to have labs in 2 weeks.  She will also keep f/u appt on 9/1

## 2019-02-26 NOTE — Telephone Encounter (Signed)
-----   Message from Irving Copas., MD sent at 02/26/2019  8:26 AM EDT ----- Chong Sicilian, Patient needs repeat HFP and INR in 2-weeks. Thanks. Gabe ----- Message ----- From: Jerene Bears, MD Sent: 02/24/2019   6:34 PM EDT To: Timothy Lasso, RN, Irving Copas., MD  Patient needs office followup --Acute hepatitis A, which is expected to resolve. --Hep C, unclear yet if active or now.  RNA pending.  If + we can refer to ID --LFTs improving Thanks

## 2019-02-27 LAB — HEPATITIS C VRS RNA DETECT BY PCR-QUAL: Hepatitis C Vrs RNA by PCR-Qual: NEGATIVE

## 2019-03-04 ENCOUNTER — Inpatient Hospital Stay: Payer: Self-pay | Admitting: Family Medicine

## 2019-03-25 ENCOUNTER — Ambulatory Visit: Payer: Self-pay | Admitting: Gastroenterology

## 2019-10-30 IMAGING — US TRANSVAGINAL ULTRASOUND OF PELVIS
1 series · 13 of 25 positions shown · non-contrast
Comparison: None.

CLINICAL DATA: Left adnexal pain.

EXAM:
TRANSABDOMINAL AND TRANSVAGINAL ULTRASOUND OF PELVIS
DOPPLER ULTRASOUND OF OVARIES
TECHNIQUE: Both transabdominal and transvaginal ultrasound examinations of the
pelvis were performed. Transabdominal technique was performed for
global imaging of the pelvis including uterus, ovaries, adnexal
regions, and pelvic cul-de-sac.
It was necessary to proceed with endovaginal exam following the
transabdominal exam to visualize the adnexal structures. Color and
duplex Doppler ultrasound was utilized to evaluate blood flow to the
ovaries.

[Series 1: transvaginal ultrasound of pelvis · 13 of 67 slices shown]
[im 1/67]
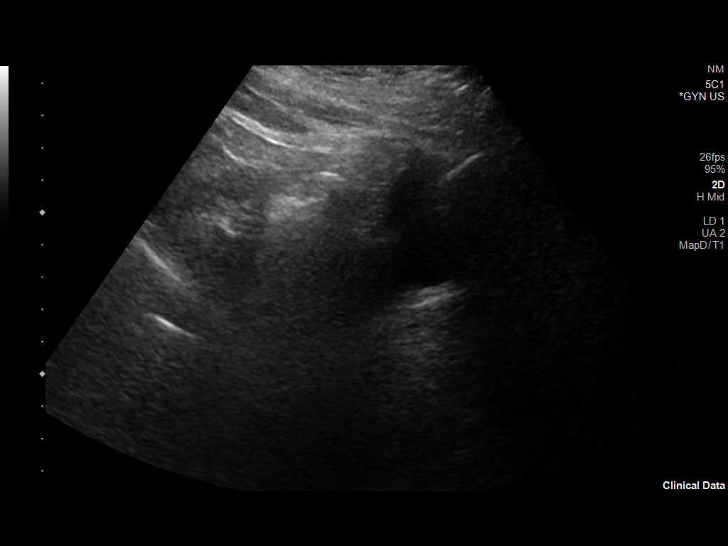
[im 6/67]
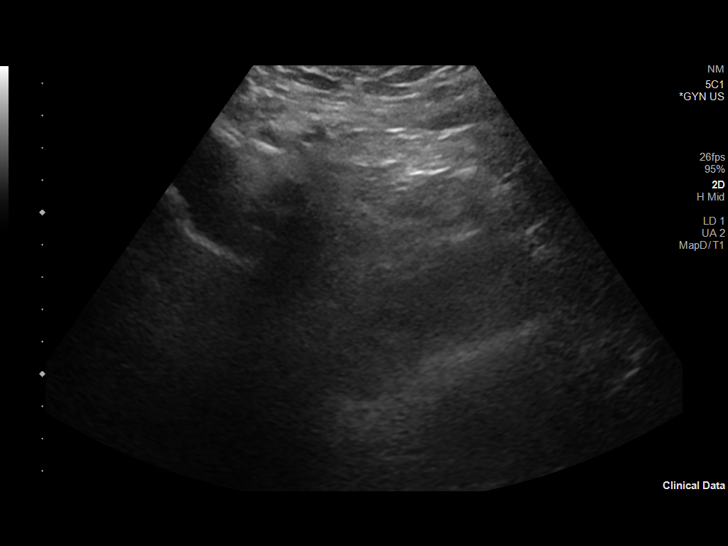
[im 12/67]
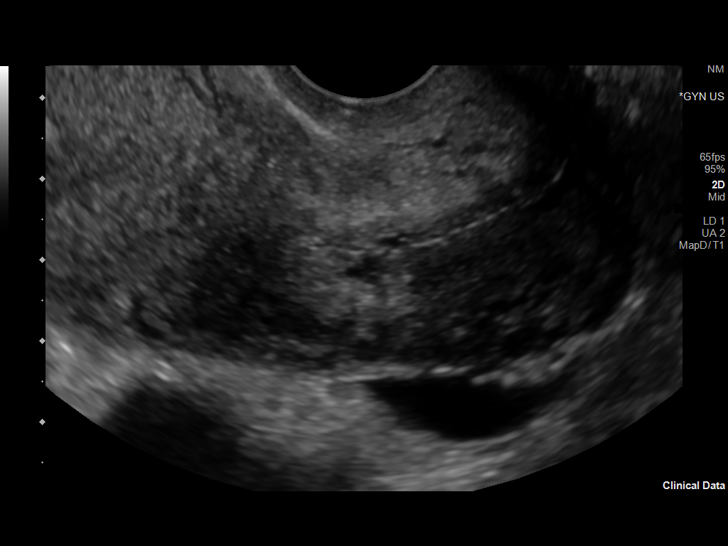
[im 17/67]
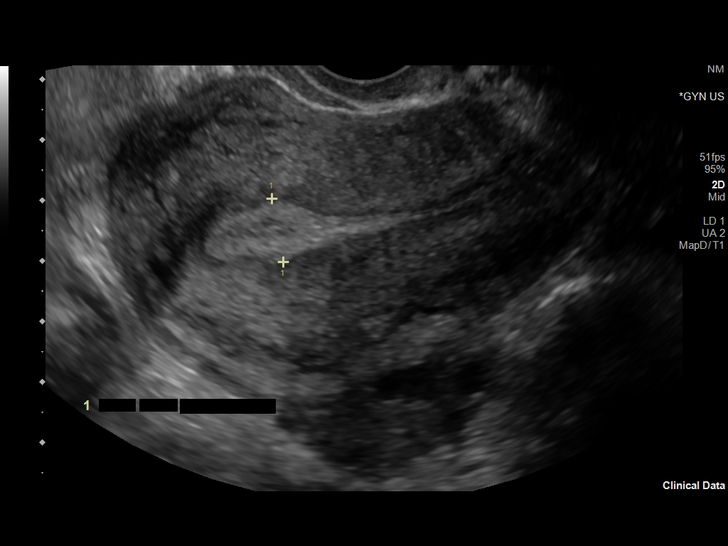
[im 23/67]
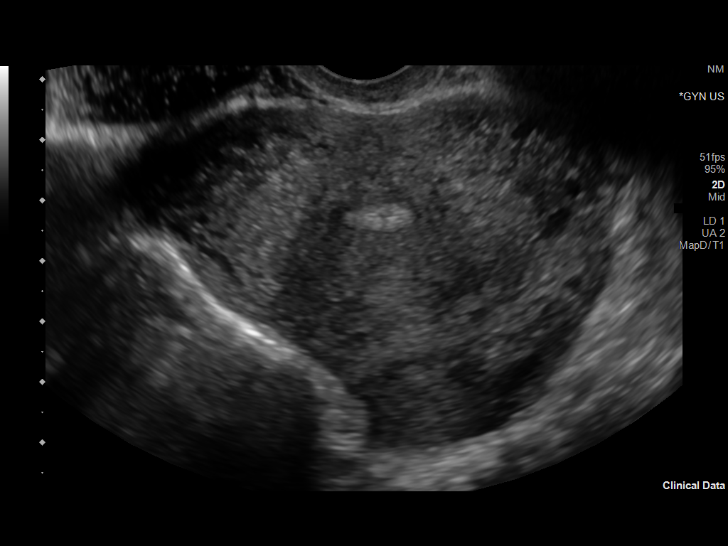
[im 28/67]
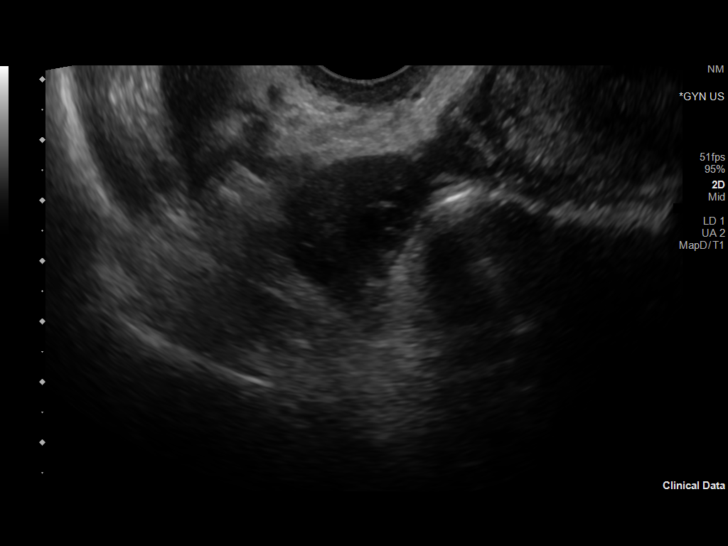
[im 34/67]
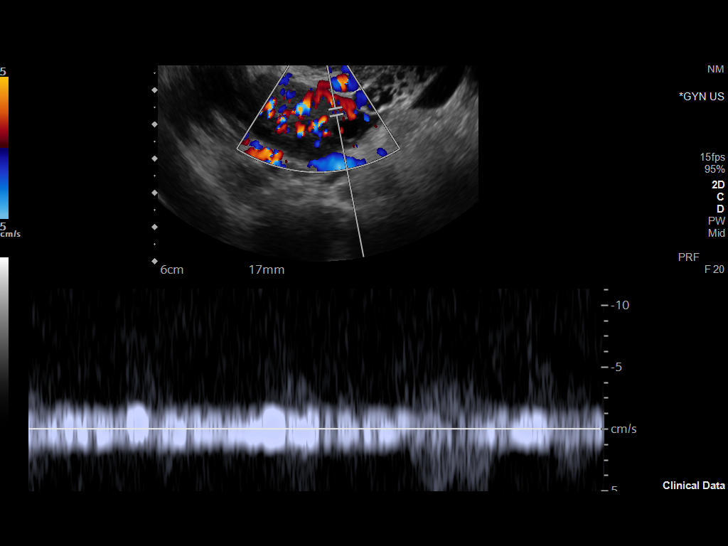
[im 39/67]
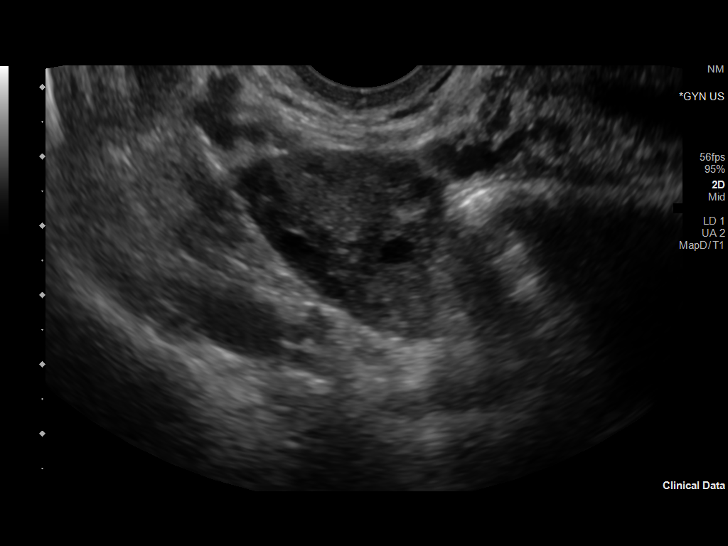
[im 45/67]
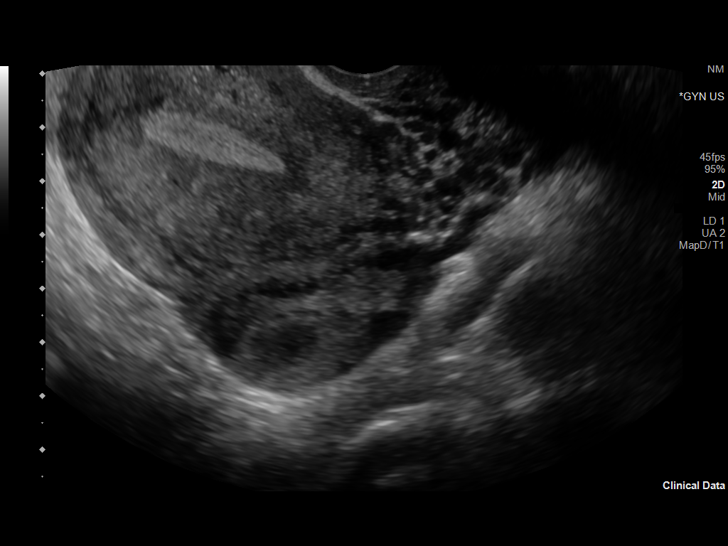
[im 50/67]
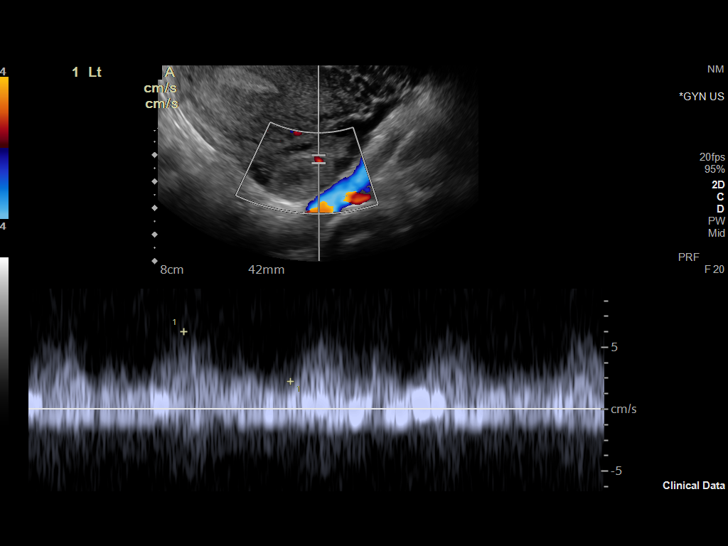
[im 56/67]
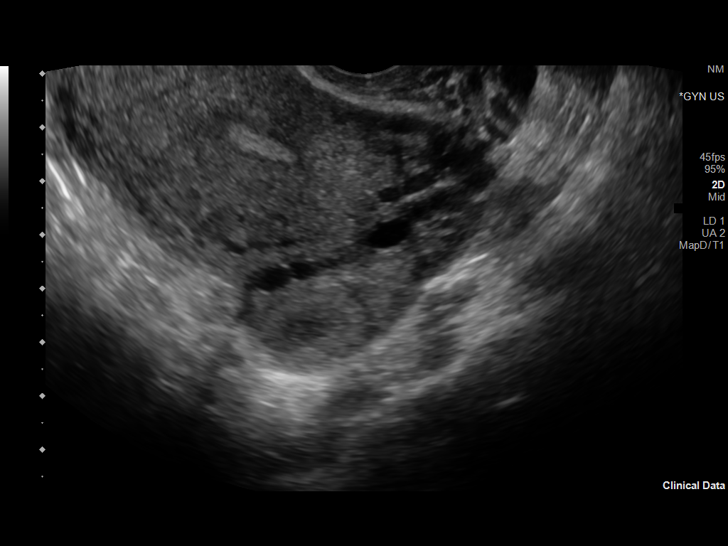
[im 61/67]
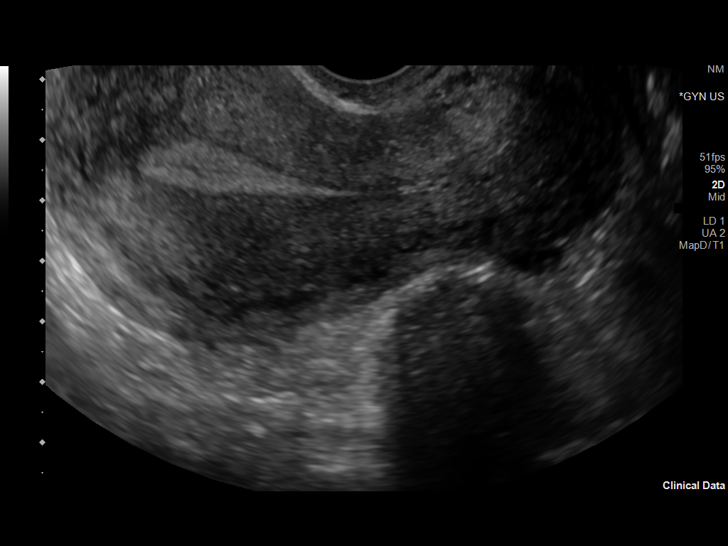
[im 67/67]
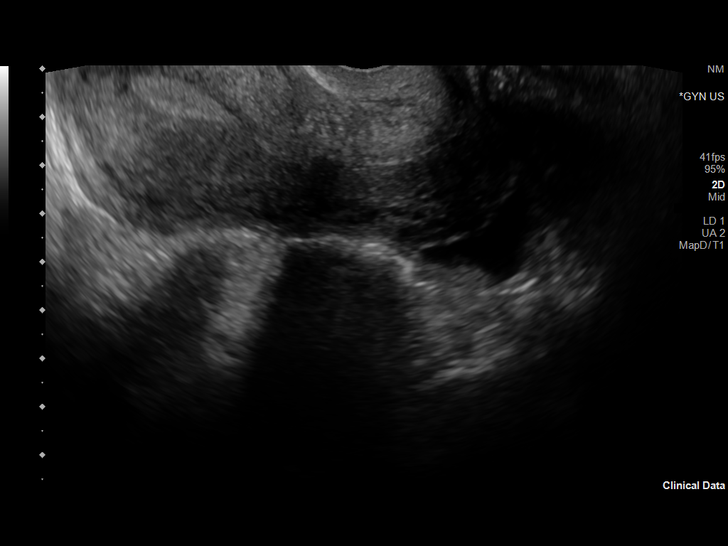

[13 of 25 positions shown; findings below may reference images not displayed]

FINDINGS: Uterus

Measurements: 9.5 x 4.3 x 5.8 cm = volume: 120.7 mL. No fibroids or
other mass visualized.

Endometrium

Thickness: 11 mm.  No focal abnormality visualized.

Right ovary

Measurements: 3.7 x 1.9 x 2.4 cm = volume: 8.4 mL. Normal
appearance/no adnexal mass.

Left ovary

Measurements: 4.0 x 2.3 x 3.4 cm = volume: 16.6 mL. Normal
appearance/no adnexal mass. Corpus luteum left ovary.

Pulsed Doppler evaluation of both ovaries demonstrates normal
low-resistance arterial and venous waveforms.

Other findings

Trace pelvic fluid.
IMPRESSION: No acute process within the pelvis. No sonographic evidence to
suggest torsion.

## 2019-10-30 IMAGING — CR CHEST - 2 VIEW
2 series · 2 of 2 positions shown · non-contrast
Comparison: None.

CLINICAL DATA: Patient with generalized weakness and abdominal
pain. Urinary frequency.

EXAM:
CHEST - 2 VIEW

[chest lat]
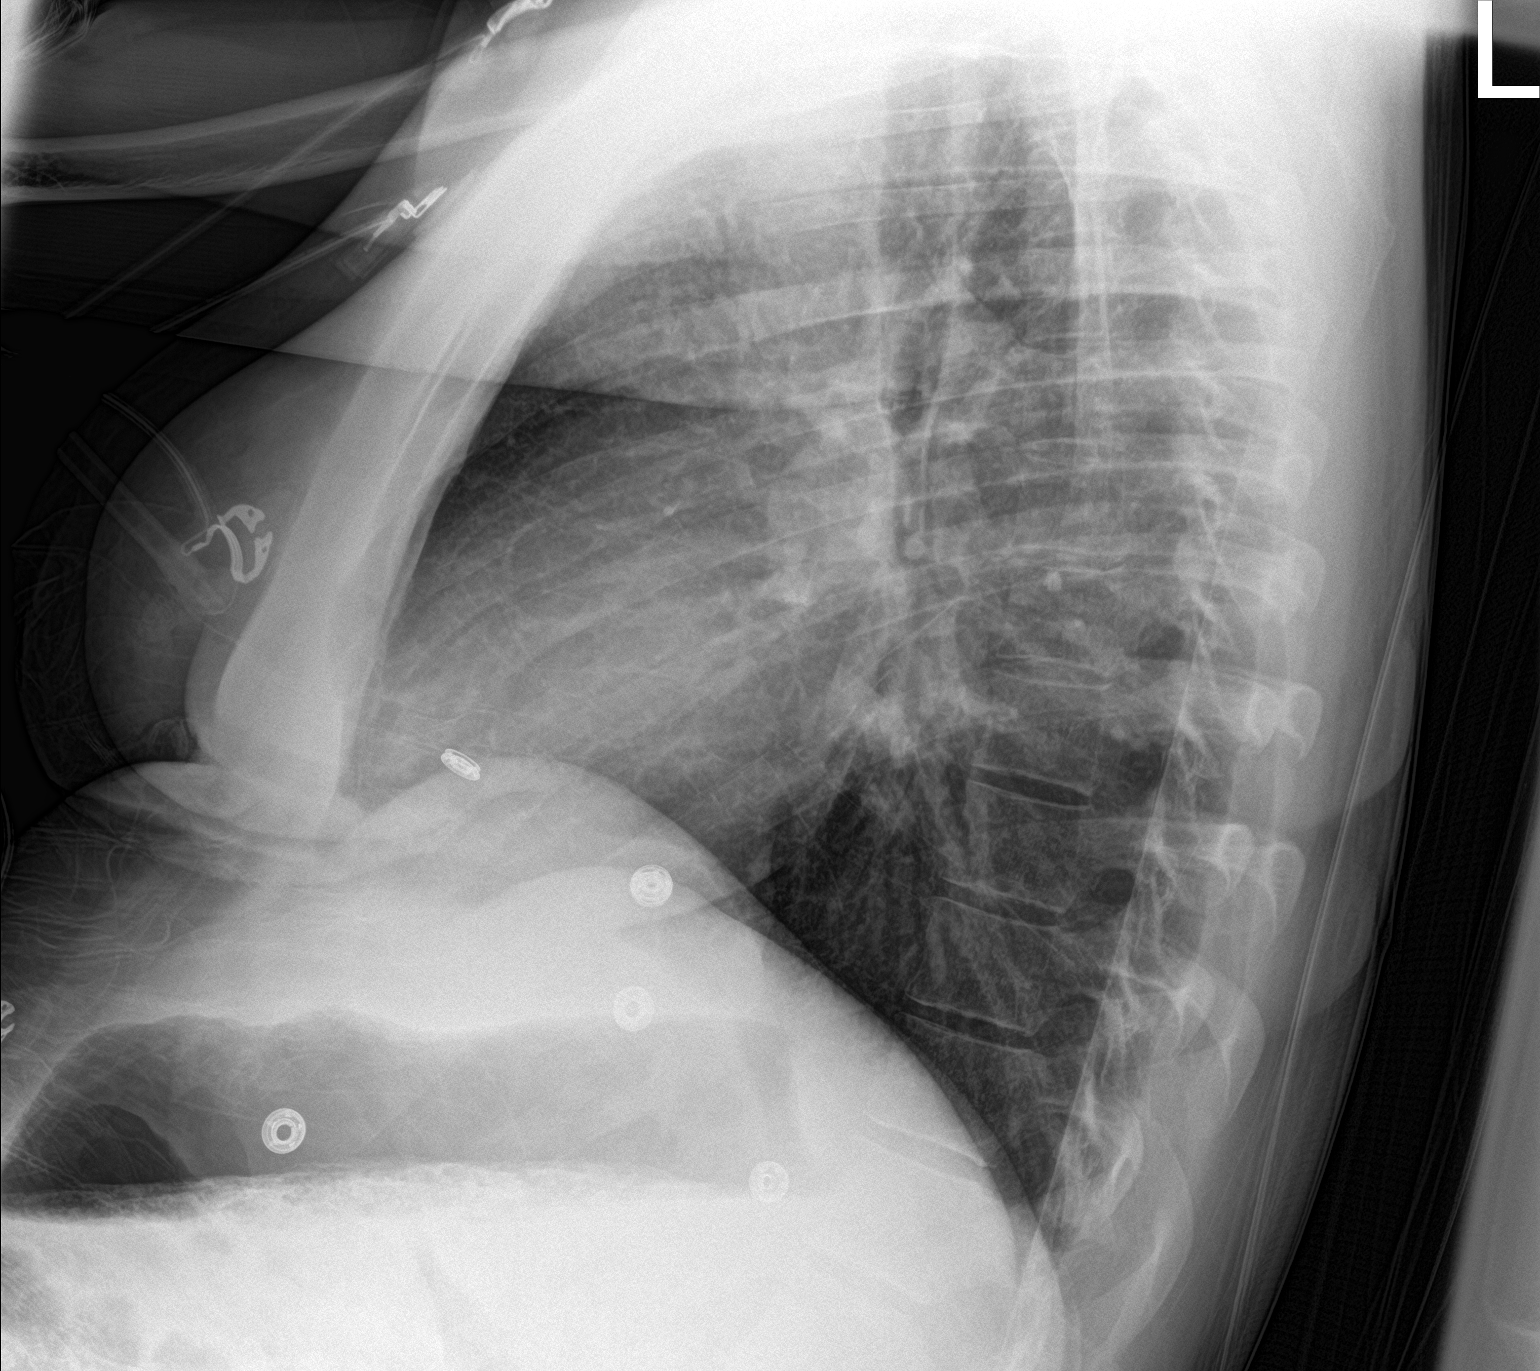

[chest ap]
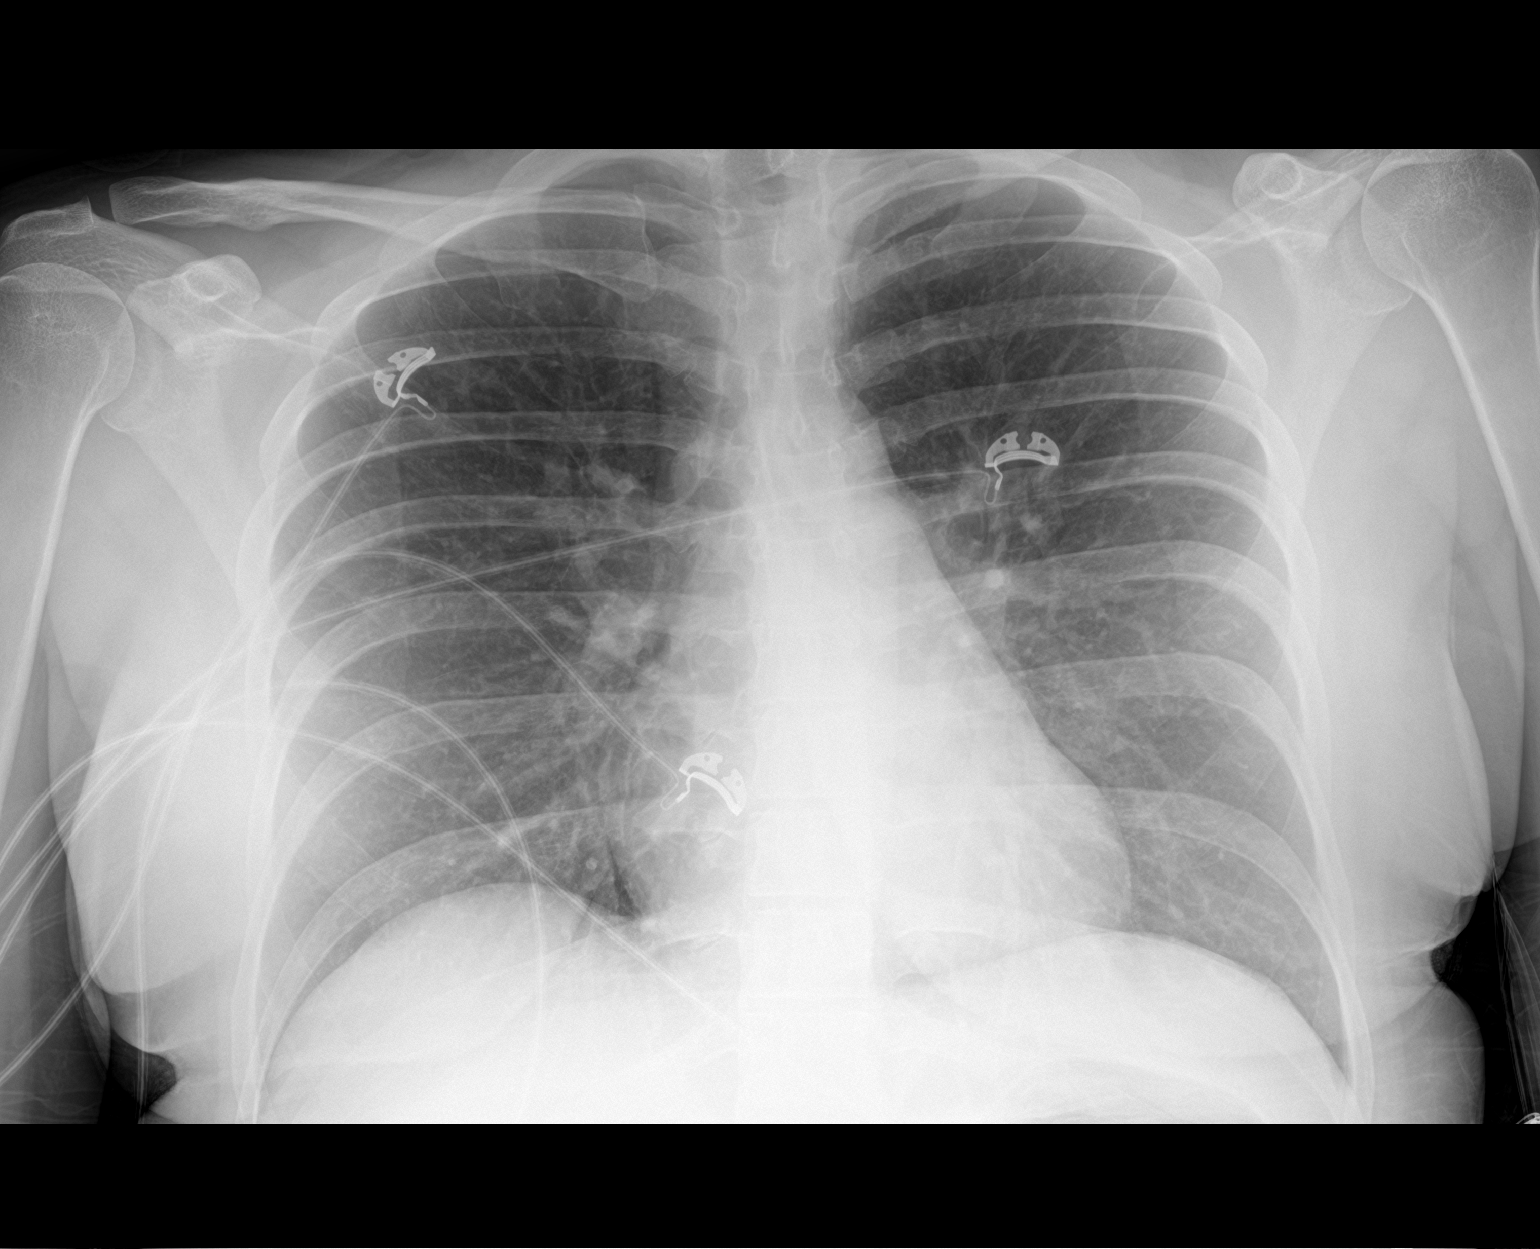

[2 of 2 positions shown; findings below may reference images not displayed]

FINDINGS: Normal cardiac and mediastinal contours. Hazy opacity over the left
mid and lower lung favored to be secondary to overlying soft tissue.
No large area pulmonary consolidation. No pleural effusion or
pneumothorax. Regional skeleton is unremarkable.
IMPRESSION: Hazy opacity left mid lung favored to be secondary to overlapping
soft tissue. Consider PA chest radiograph.

Otherwise no acute cardiopulmonary process.

## 2021-12-08 ENCOUNTER — Encounter (HOSPITAL_BASED_OUTPATIENT_CLINIC_OR_DEPARTMENT_OTHER): Payer: Self-pay

## 2021-12-08 ENCOUNTER — Emergency Department (HOSPITAL_BASED_OUTPATIENT_CLINIC_OR_DEPARTMENT_OTHER)
Admission: EM | Admit: 2021-12-08 | Discharge: 2021-12-08 | Discharge status: Against Medical Advice | Payer: Self-pay | Attending: Emergency Medicine | Admitting: Emergency Medicine

## 2021-12-08 DIAGNOSIS — N764 Abscess of vulva: Secondary | ICD-10-CM | POA: Insufficient documentation

## 2021-12-08 DIAGNOSIS — Z5321 Procedure and treatment not carried out due to patient leaving prior to being seen by health care provider: Secondary | ICD-10-CM | POA: Insufficient documentation

## 2021-12-08 NOTE — ED Triage Notes (Signed)
Pt here for left outer vaginal area abscess x 3 weeks. Also endorses left cheek swollen x 2 weeks due to "teeth messed up". Denies fever, NAD.

## 2021-12-08 NOTE — ED Notes (Signed)
Name called for triage 3 times, no answer
